# Patient Record
Sex: Male | Born: 1935 | Race: White | Hispanic: No | Marital: Married | Smoking: Former smoker
Health system: Southern US, Community
[De-identification: ages and names within clinical notes are randomized; demographics above are authoritative.]

## PROBLEM LIST (undated history)

## (undated) DIAGNOSIS — F0390 Unspecified dementia without behavioral disturbance: Secondary | ICD-10-CM

## (undated) DIAGNOSIS — I10 Essential (primary) hypertension: Secondary | ICD-10-CM

## (undated) DIAGNOSIS — M7550 Bursitis of unspecified shoulder: Secondary | ICD-10-CM

## (undated) DIAGNOSIS — N400 Enlarged prostate without lower urinary tract symptoms: Secondary | ICD-10-CM

## (undated) DIAGNOSIS — J449 Chronic obstructive pulmonary disease, unspecified: Secondary | ICD-10-CM

## (undated) DIAGNOSIS — M1990 Unspecified osteoarthritis, unspecified site: Secondary | ICD-10-CM

## (undated) DIAGNOSIS — R001 Bradycardia, unspecified: Secondary | ICD-10-CM

## (undated) HISTORY — DX: Benign prostatic hyperplasia without lower urinary tract symptoms: N40.0

## (undated) HISTORY — DX: Bradycardia, unspecified: R00.1

## (undated) HISTORY — PX: TOTAL KNEE ARTHROPLASTY: SHX125

## (undated) HISTORY — DX: Unspecified osteoarthritis, unspecified site: M19.90

## (undated) HISTORY — DX: Bursitis of unspecified shoulder: M75.50

## (undated) HISTORY — DX: Chronic obstructive pulmonary disease, unspecified: J44.9

## (undated) HISTORY — PX: LUMBAR LAMINECTOMY: SHX95

## (undated) HISTORY — DX: Essential (primary) hypertension: I10

---

## 1999-02-04 ENCOUNTER — Ambulatory Visit (HOSPITAL_BASED_OUTPATIENT_CLINIC_OR_DEPARTMENT_OTHER): Admission: RE | Admit: 1999-02-04 | Discharge: 1999-02-04 | Payer: Self-pay | Admitting: Orthopedic Surgery

## 1999-11-03 ENCOUNTER — Encounter: Payer: Self-pay | Admitting: Orthopedic Surgery

## 1999-11-05 ENCOUNTER — Inpatient Hospital Stay (HOSPITAL_COMMUNITY): Admission: RE | Admit: 1999-11-05 | Discharge: 1999-11-09 | Payer: Self-pay | Admitting: Orthopedic Surgery

## 1999-11-05 ENCOUNTER — Encounter: Payer: Self-pay | Admitting: Orthopedic Surgery

## 2004-08-06 ENCOUNTER — Ambulatory Visit: Payer: Self-pay | Admitting: Internal Medicine

## 2004-09-01 ENCOUNTER — Encounter: Admission: RE | Admit: 2004-09-01 | Discharge: 2004-09-01 | Payer: Self-pay | Admitting: Orthopedic Surgery

## 2004-09-04 ENCOUNTER — Ambulatory Visit (HOSPITAL_COMMUNITY): Admission: RE | Admit: 2004-09-04 | Discharge: 2004-09-04 | Payer: Self-pay | Admitting: Orthopedic Surgery

## 2004-09-04 ENCOUNTER — Ambulatory Visit (HOSPITAL_BASED_OUTPATIENT_CLINIC_OR_DEPARTMENT_OTHER): Admission: RE | Admit: 2004-09-04 | Discharge: 2004-09-04 | Payer: Self-pay | Admitting: Orthopedic Surgery

## 2005-02-04 ENCOUNTER — Ambulatory Visit: Payer: Self-pay | Admitting: Internal Medicine

## 2005-04-07 ENCOUNTER — Inpatient Hospital Stay (HOSPITAL_COMMUNITY): Admission: RE | Admit: 2005-04-07 | Discharge: 2005-04-11 | Payer: Self-pay | Admitting: Orthopedic Surgery

## 2006-04-07 ENCOUNTER — Ambulatory Visit: Payer: Self-pay | Admitting: Internal Medicine

## 2006-09-15 ENCOUNTER — Ambulatory Visit: Payer: Self-pay | Admitting: Internal Medicine

## 2006-09-20 ENCOUNTER — Ambulatory Visit (HOSPITAL_COMMUNITY): Admission: RE | Admit: 2006-09-20 | Discharge: 2006-09-20 | Payer: Self-pay | Admitting: Internal Medicine

## 2007-07-12 DIAGNOSIS — R351 Nocturia: Secondary | ICD-10-CM | POA: Insufficient documentation

## 2007-07-12 DIAGNOSIS — N400 Enlarged prostate without lower urinary tract symptoms: Secondary | ICD-10-CM | POA: Insufficient documentation

## 2007-07-12 DIAGNOSIS — J449 Chronic obstructive pulmonary disease, unspecified: Secondary | ICD-10-CM | POA: Insufficient documentation

## 2007-07-12 DIAGNOSIS — I10 Essential (primary) hypertension: Secondary | ICD-10-CM | POA: Insufficient documentation

## 2007-07-12 DIAGNOSIS — M1990 Unspecified osteoarthritis, unspecified site: Secondary | ICD-10-CM | POA: Insufficient documentation

## 2007-07-12 DIAGNOSIS — J4489 Other specified chronic obstructive pulmonary disease: Secondary | ICD-10-CM | POA: Insufficient documentation

## 2007-07-18 ENCOUNTER — Ambulatory Visit: Payer: Self-pay | Admitting: Internal Medicine

## 2007-11-24 ENCOUNTER — Ambulatory Visit: Payer: Self-pay

## 2007-12-19 ENCOUNTER — Encounter: Payer: Self-pay | Admitting: Internal Medicine

## 2007-12-22 ENCOUNTER — Encounter: Admission: RE | Admit: 2007-12-22 | Discharge: 2007-12-22 | Payer: Self-pay | Admitting: Orthopaedic Surgery

## 2008-01-10 ENCOUNTER — Encounter: Payer: Self-pay | Admitting: Internal Medicine

## 2008-01-10 ENCOUNTER — Encounter: Admission: RE | Admit: 2008-01-10 | Discharge: 2008-01-10 | Payer: Self-pay | Admitting: Orthopaedic Surgery

## 2008-01-18 ENCOUNTER — Ambulatory Visit: Payer: Self-pay | Admitting: Internal Medicine

## 2008-01-18 DIAGNOSIS — R413 Other amnesia: Secondary | ICD-10-CM | POA: Insufficient documentation

## 2008-02-22 ENCOUNTER — Encounter: Payer: Self-pay | Admitting: Internal Medicine

## 2008-04-11 ENCOUNTER — Ambulatory Visit: Payer: Self-pay | Admitting: Internal Medicine

## 2008-04-11 DIAGNOSIS — IMO0002 Reserved for concepts with insufficient information to code with codable children: Secondary | ICD-10-CM

## 2008-06-19 ENCOUNTER — Ambulatory Visit: Payer: Self-pay | Admitting: Internal Medicine

## 2008-08-09 ENCOUNTER — Telehealth: Payer: Self-pay | Admitting: *Deleted

## 2008-11-15 ENCOUNTER — Telehealth: Payer: Self-pay | Admitting: Internal Medicine

## 2008-11-20 ENCOUNTER — Encounter: Payer: Self-pay | Admitting: Internal Medicine

## 2008-12-27 ENCOUNTER — Ambulatory Visit: Payer: Self-pay | Admitting: Internal Medicine

## 2010-03-24 ENCOUNTER — Ambulatory Visit: Payer: Self-pay | Admitting: Internal Medicine

## 2010-09-30 ENCOUNTER — Ambulatory Visit
Admission: RE | Admit: 2010-09-30 | Discharge: 2010-09-30 | Payer: Self-pay | Source: Home / Self Care | Attending: Internal Medicine | Admitting: Internal Medicine

## 2010-10-06 NOTE — Assessment & Plan Note (Signed)
Summary: MED CK (REFILL) // RS   Vital Signs:  Patient profile:   77 year old male Height:      70 inches Temp:     98.2 degrees F oral Pulse rate:   80 / minute Resp:     16 per minute BP sitting:   144 / 80  (left arm)  Vitals Entered By: Willy Eddy, LPN (March 24, 2010 4:15 PM) CC: med review Is Patient Diabetic? No   CC:  med review.  History of Present Illness: progressive dementia the nameda and the aricept but has progressive back pain moved down right leg to the knee and has arthritis in the right hand HTN stable  Preventive Screening-Counseling & Management  Alcohol-Tobacco     Smoking Status: quit  Current Problems (verified): 1)  Wrist Pain, Right  (ICD-719.43) 2)  Back Pain With Radiculopathy  (ICD-729.2) 3)  Memory Loss  (ICD-780.93) 4)  COPD  (ICD-496) 5)  Osteoarthritis  (ICD-715.90) 6)  Nocturia  (ICD-788.43) 7)  Benign Prostatic Hypertrophy  (ICD-600.00) 8)  Hypertension  (ICD-401.9)  Current Medications (verified): 1)  Terazosin Hcl 5 Mg Caps (Terazosin Hcl) .... Once Daily 2)  Furosemide 20 Mg Tabs (Furosemide) .... Once Daily 3)  Tarka 2-240 Mg Tbcr (Trandolapril-Verapamil Hcl) .... Two Times A Day 4)  Multivitamins   Caps (Multiple Vitamin) .... Once Daily 5)  Aricept 10 Mg Tabs (Donepezil Hcl) .... One By Mouth Daily 6)  Ultram 50 Mg  Tabs (Tramadol Hcl) .... One By Mouth Q 6 Hour For Pain 7)  Namenda 10 Mg Tabs (Memantine Hcl) .... One By Mouth Two Times A Day  in Addition To The Aricept  Allergies (verified): No Known Drug Allergies   Impression & Recommendations:  Problem # 1:  BACK PAIN WITH RADICULOPATHY (ICD-729.2) needs pain control at night tylenol works in the day urged the use of the tramadol  Problem # 2:  MEMORY LOSS (ICD-780.93) on aricept with the namenda stalbe  Problem # 3:  NOCTURIA (ICD-788.43) stable meds terazosin three times a day  Problem # 4:  HYPERTENSION (ICD-401.9)  His updated medication list  for this problem includes:    Terazosin Hcl 5 Mg Caps (Terazosin hcl) ..... Once daily    Furosemide 20 Mg Tabs (Furosemide) ..... Once daily    Tarka 2-240 Mg Tbcr (Trandolapril-verapamil hcl) .Marland Kitchen..Marland Kitchen Two times a day  BP today: 144/80 Prior BP: 146/80 (12/27/2008)  Prior 10 Yr Risk Heart Disease: Not enough information (07/18/2007)  Complete Medication List: 1)  Terazosin Hcl 5 Mg Caps (Terazosin hcl) .... Once daily 2)  Furosemide 20 Mg Tabs (Furosemide) .... Once daily 3)  Tarka 2-240 Mg Tbcr (Trandolapril-verapamil hcl) .... Two times a day 4)  Multivitamins Caps (Multiple vitamin) .... Once daily 5)  Aricept 10 Mg Tabs (Donepezil hcl) .... One by mouth daily 6)  Tramadol Hcl 50 Mg Tabs (Tramadol hcl) .... One by mouth three times a day as needed 7)  Namenda 10 Mg Tabs (Memantine hcl) .... One by mouth two times a day  in addition to the aricept  Patient Instructions: 1)  Please schedule a follow-up appointment in 4 months. Prescriptions: TRAMADOL HCL 50 MG TABS (TRAMADOL HCL) one by mouth three times a day as needed  #270 x 2   Entered and Authorized by:   Stacie Glaze MD   Signed by:   Stacie Glaze MD on 03/24/2010   Method used:   Electronically to  [facility]* (mail-order)       [address]       [place], [place]  [address]       Ph: [phone]       Fax: [phone]   RxID:   [id number] NAMENDA 10 MG TABS (MEMANTINE HCL) one by mouth two times a day  in addition to the aricept  #180 Each x 3   Entered by:   Willy Eddy, LPN   Authorized by:   Stacie Glaze MD   Signed by:   Willy Eddy, LPN on 84/13/2440   Method used:   Electronically to        [facility]* (mail-order)       [address]       [place], [place]  [address]       Ph: [phone]       Fax: [phone]   RxID:   [id number] ARICEPT 10 MG TABS (DONEPEZIL HCL) one by mouth daily  #90 x 3   Entered by:   Willy Eddy, LPN    Authorized by:   Stacie Glaze MD   Signed by:   Willy Eddy, LPN on 30/16/0109   Method used:   Electronically to        [facility]* (mail-order)       [address]       [place], [place]  [address]       Ph: [phone]       Fax: [phone]   RxID:   [id number] TARKA 2-240 MG TBCR (TRANDOLAPRIL-VERAPAMIL HCL) two times a day  #90 x 3   Entered by:   Willy Eddy, LPN   Authorized by:   Stacie Glaze MD   Signed by:   Willy Eddy, LPN on 48/54/6270   Method used:   Electronically to        [facility]* (mail-order)       [address]       [place], [place]  [address]       Ph: [phone]       Fax: [phone]   RxID:   [id number] FUROSEMIDE 20 MG TABS (FUROSEMIDE) once daily  #90 x 3   Entered by:   Willy Eddy, LPN   Authorized by:   Stacie Glaze MD   Signed by:   Willy Eddy, LPN on 35/36/1443   Method used:   Electronically to        [facility]* (mail-order)       [address]       [place], [place]  [address]       Ph: [phone]       Fax: [phone]   RxID:   [id number] TERAZOSIN HCL 5 MG CAPS (TERAZOSIN HCL) once daily  #90 x 3   Entered by:   Willy Eddy, LPN   Authorized by:   Stacie Glaze MD   Signed by:   Willy Eddy, LPN on 19/37/9024   Method used:   Electronically to        [facility]* (mail-order)       [address]   [address]       Ph: [phone]       Fax: [phone]   RxID:   [id number]

## 2010-10-08 NOTE — Assessment & Plan Note (Signed)
Summary: 6 month fup//ccm/pt rescd from bump//ccm   Vital Signs:  Patient profile:   77 year old male Height:      70 inches Weight:      234 pounds BMI:     33.70 Temp:     98.2 degrees F oral Pulse rate:   84 / minute Resp:     20 per minute BP sitting:   134 / 80  (left arm)  Vitals Entered By: Willy Eddy, LPN (September 30, 2010 1:41 PM) CC: roa- daughter concerned about loss os appetite, Hypertension Management Is Patient Diabetic? No   CC:  roa- daughter concerned about loss os appetite and Hypertension Management.  History of Present Illness: feels good has some "numbness in the right hand" Has good strength.  No falls but walking on two canes due to severe DJD in knees Has lost appetitie and has ;lost 16 pound  Hypertension History:      He denies headache, chest pain, palpitations, dyspnea with exertion, orthopnea, PND, peripheral edema, visual symptoms, neurologic problems, syncope, and side effects from treatment.        Positive major cardiovascular risk factors include male age 50 years old or older and hypertension.  Negative major cardiovascular risk factors include non-tobacco-user status.     Preventive Screening-Counseling & Management  Alcohol-Tobacco     Smoking Status: never     Tobacco Counseling: not indicated; no tobacco use  Problems Prior to Update: 1)  Wrist Pain, Right  (ICD-719.43) 2)  Back Pain With Radiculopathy  (ICD-729.2) 3)  Memory Loss  (ICD-780.93) 4)  COPD  (ICD-496) 5)  Osteoarthritis  (ICD-715.90) 6)  Nocturia  (ICD-788.43) 7)  Benign Prostatic Hypertrophy  (ICD-600.00) 8)  Hypertension  (ICD-401.9)  Current Problems (verified): 1)  Wrist Pain, Right  (ICD-719.43) 2)  Back Pain With Radiculopathy  (ICD-729.2) 3)  Memory Loss  (ICD-780.93) 4)  COPD  (ICD-496) 5)  Osteoarthritis  (ICD-715.90) 6)  Nocturia  (ICD-788.43) 7)  Benign Prostatic Hypertrophy  (ICD-600.00) 8)  Hypertension  (ICD-401.9)  Medications Prior to  Update: 1)  Terazosin Hcl 5 Mg Caps (Terazosin Hcl) .... Once Daily 2)  Furosemide 20 Mg Tabs (Furosemide) .... Once Daily 3)  Tarka 2-240 Mg Tbcr (Trandolapril-Verapamil Hcl) .... Two Times A Day 4)  Multivitamins   Caps (Multiple Vitamin) .... Once Daily 5)  Aricept 10 Mg Tabs (Donepezil Hcl) .... One By Mouth Daily 6)  Tramadol Hcl 50 Mg Tabs (Tramadol Hcl) .... One By Mouth Three Times A Day As Needed 7)  Namenda 10 Mg Tabs (Memantine Hcl) .... One By Mouth Two Times A Day  in Addition To The Aricept  Current Medications (verified): 1)  Terazosin Hcl 5 Mg Caps (Terazosin Hcl) .... Once Daily 2)  Furosemide 20 Mg Tabs (Furosemide) .... Once Daily 3)  Trandolapril 2 Mg Tabs (Trandolapril) .Marland Kitchen.. 1 Two Times A Day 4)  Multivitamins   Caps (Multiple Vitamin) .... Once Daily 5)  Aricept 10 Mg Tabs (Donepezil Hcl) .... One By Mouth Daily 6)  Tramadol Hcl 50 Mg Tabs (Tramadol Hcl) .... One By Mouth Three Times A Day As Needed 7)  Namenda 10 Mg Tabs (Memantine Hcl) .... One By Mouth Two Times A Day  in Addition To The Aricept 8)  Verapamil Hcl Cr 240 Mg Cr-Tabs (Verapamil Hcl) .Marland Kitchen.. 1 Two Times A Day  Allergies (verified): No Known Drug Allergies  Past History:  Social History: Last updated: 07/18/2007 Retired Married Former Smoker  Risk Factors:  Smoking Status: never (09/30/2010)  Past medical, surgical, family and social histories (including risk factors) reviewed, and no changes noted (except as noted below).  Past Medical History: Reviewed history from 04/11/2008 and no changes required. Hypertension Benign prostatic hypertrophy Osteoarthritis COPD bursitis in shoulder bradycardia  Past Surgical History: Reviewed history from 01/18/2008 and no changes required. Total knee replacement 2006 Lumbar laminectomy Lumbar fusion lumbar injections PMH-FH-SH reviewed-no changes except otherwise noted  Family History: Reviewed history and no changes required.  Social  History: Reviewed history from 07/18/2007 and no changes required. Retired Married Former Smoker Smoking Status:  never  Review of Systems  The patient denies anorexia, fever, weight loss, weight gain, vision loss, decreased hearing, hoarseness, chest pain, syncope, dyspnea on exertion, peripheral edema, prolonged cough, headaches, hemoptysis, abdominal pain, melena, hematochezia, severe indigestion/heartburn, hematuria, incontinence, genital sores, muscle weakness, suspicious skin lesions, transient blindness, difficulty walking, depression, unusual weight change, abnormal bleeding, enlarged lymph nodes, angioedema, and breast masses.    Physical Exam  General:  Well-developed,well-nourished,in no acute distress; alert,appropriate and cooperative throughout examination Head:  male-pattern balding.  atraumatic.   Eyes:  pupils equal and pupils round.   Ears:  R ear normal and L ear normal.   Nose:  External nasal examination shows no deformity or inflammation. Nasal mucosa are pink and moist without lesions or exudates. Mouth:  Oral mucosa and oropharynx without lesions or exudates.  Teeth in good repair. Neck:  No deformities, masses, or tenderness noted. Lungs:  Normal respiratory effort, chest expands symmetrically. Lungs are clear to auscultation, no crackles or wheezes. Heart:  regular rhythm, no gallop, bradycardia, and Grade   1/6 systolic ejection murmur.   Abdomen:  Bowel sounds positive,abdomen soft and non-tender without masses, organomegaly or hernias noted. Msk:  decreased ROM and joint tenderness.   Pulses:  R and L carotid,radial,femoral,dorsalis pedis and posterior tibial pulses are full and equal bilaterally Extremities:  1+ left pedal edema and 1+ right pedal edema.   Neurologic:  cranial nerves II-XII intact and finger-to-nose normal.   Skin:  Intact without suspicious lesions or rashes Psych:  subdued, poor concentration, and memory impairment.     Impression &  Recommendations:  Problem # 1:  MEMORY LOSS (ICD-780.93) cannot recall any objects but reads clock... cannot draw clock seems to be stable  Problem # 2:  COPD (ICD-496) Assessment: Unchanged  Vaccines Reviewed: Flu Vax: Fluvax 3+ (06/19/2008)  Problem # 3:  OSTEOARTHRITIS (ICD-715.90)  His updated medication list for this problem includes:    Tramadol Hcl 50 Mg Tabs (Tramadol hcl) ..... One by mouth three times a day as needed  Discussed use of medications, application of heat or cold, and exercises.   Problem # 4:  HYPERTENSION (ICD-401.9)  His updated medication list for this problem includes:    Terazosin Hcl 5 Mg Caps (Terazosin hcl) ..... Once daily    Furosemide 20 Mg Tabs (Furosemide) ..... Once daily    Trandolapril 2 Mg Tabs (Trandolapril) .Marland Kitchen... 1 two times a day    Verapamil Hcl Cr 240 Mg Cr-tabs (Verapamil hcl) .Marland Kitchen... 1 two times a day  BP today: 134/80 Prior BP: 144/80 (03/24/2010)  Prior 10 Yr Risk Heart Disease: Not enough information (07/18/2007)  Complete Medication List: 1)  Terazosin Hcl 5 Mg Caps (Terazosin hcl) .... Once daily 2)  Furosemide 20 Mg Tabs (Furosemide) .... Once daily 3)  Trandolapril 2 Mg Tabs (Trandolapril) .Marland Kitchen.. 1 two times a day 4)  Multivitamins Caps (Multiple vitamin) .... Once  daily 5)  Aricept 10 Mg Tabs (Donepezil hcl) .... One by mouth daily 6)  Tramadol Hcl 50 Mg Tabs (Tramadol hcl) .... One by mouth three times a day as needed 7)  Namenda 10 Mg Tabs (Memantine hcl) .... One by mouth two times a day  in addition to the aricept 8)  Verapamil Hcl Cr 240 Mg Cr-tabs (Verapamil hcl) .Marland Kitchen.. 1 two times a day  Hypertension Assessment/Plan:      The patient's hypertensive risk group is category B: At least one risk factor (excluding diabetes) with no target organ damage.  Today's blood pressure is 134/80.  His blood pressure goal is < 140/90.  Patient Instructions: 1)  Please schedule a follow-up appointment in 4  months. Prescriptions: NAMENDA 10 MG TABS (MEMANTINE HCL) one by mouth two times a day  in addition to the aricept  #180 Each x 3   Entered by:   Willy Eddy, LPN   Authorized by:   Stacie Glaze MD   Signed by:   Willy Eddy, LPN on 16/06/9603   Method used:   Electronically to        [facility]* (retail)       [address] [place], [place]  [address]       Ph: [phone] or [phone]       Fax: [phone]   RxID:   [id number] ARICEPT 10 MG TABS (DONEPEZIL HCL) one by mouth daily  #90 x 3   Entered by:   Willy Eddy, LPN   Authorized by:   Stacie Glaze MD   Signed by:   Willy Eddy, LPN on 40/34/7425   Method used:   Electronically to        [facility]* (retail)       [address] [place], [place]  [address]       Ph: [phone] or [phone]       Fax: [phone]   RxID:   [id number] FUROSEMIDE 20 MG TABS (FUROSEMIDE) once daily  #90 x 3   Entered by:   Willy Eddy, LPN   Authorized by:   Stacie Glaze MD   Signed by:   Willy Eddy, LPN on 62/83/1517   Method used:   Electronically to        [facility]* (retail)       [address] [place], [place]  [address]       Ph: [phone] or [phone]       Fax: [phone]   RxID:   [id number] TERAZOSIN HCL 5 MG CAPS (TERAZOSIN HCL) once daily  #90 x 3   Entered by:   Willy Eddy, LPN   Authorized by:   Stacie Glaze MD   Signed by:   Willy Eddy, LPN on 25/85/2778   Method used:   Electronically to        [facility]* (retail)       [address] [place], [place]  [address]  Ph: [phone] or [phone]       Fax: [phone]   RxID:   [id number] VERAPAMIL HCL CR 240 MG CR-TABS  (VERAPAMIL HCL) 1 two times a day  #60 x 6   Entered by:   Willy Eddy, LPN   Authorized by:   Stacie Glaze MD   Signed by:   Willy Eddy, LPN on 72/53/6644   Method used:   Electronically to        [facility]* (retail)       [address] [place], [place]  [address]       Ph: [phone] or [phone]       Fax: [phone]   RxID:   [id number] TRANDOLAPRIL 2 MG TABS (TRANDOLAPRIL) 1 two times a day  #60 x 6   Entered by:   Willy Eddy, LPN   Authorized by:   Stacie Glaze MD   Signed by:   Willy Eddy, LPN on 27/02/2375   Method used:   Electronically to        [facility]* (retail)       [address] [place], [place]  [address]       Ph: [phone] or [phone]       Fax: [phone]   RxID:   [id number]    Orders Added: 1)  Est. Patient Level IV [10175]

## 2011-01-22 NOTE — Discharge Summary (Signed)
NAME:  Glen Baldwin, Glen Baldwin                 ACCOUNT NO.:  [id number]   MEDICAL RECORD NO.:  [id number]          PATIENT TYPE:  INP   LOCATION:  1521                         FACILITY:  [facility]   PHYSICIAN:  Georges Lynch. Gioffre, M.D.DATE OF BIRTH:  11-01-1924   DATE OF ADMISSION:  04/07/2005  DATE OF DISCHARGE:  04/11/2005                                 DISCHARGE SUMMARY   ADMISSION DIAGNOSES:  1.  Degenerative arthritis, right knee.  2.  Hypertension.  3.  Benign prostatic hypertrophy.   DISCHARGE DIAGNOSES:  1.  Degenerative arthritis, right knee, status post right total knee      arthroplasty.  2.  Hypertension.  3.  Benign prostatic hypertrophy.   PROCEDURE:  Patient was admitted to [facility] and taken to the  operating room on April 07, 2005 to undergo a right total knee arthroplasty.  Surgeon Ranee Gosselin, M.D.  Assistant [employer], PA-C.  Surgery was  performed under general anesthesia.  Hemovac drain was placed at the time of  surgery.   BRIEF HISTORY:  This is a 76 year old male who has been having right knee  pain for more than a year.  His pain has been increasing.  It was difficult  for him to ambulate.  Patient underwent a right knee arthroscopy in February  2005, and his symptoms temporarily improved, but now he is having increased  knee pain and feels that his knee is unstable.  He has very limited range of  motion and is ready to proceed with a right total knee arthroplasty.  He was  admitted to the hospital for same.   LABORATORY DATA:  Admission CBC WBC 7.4, RBC 4.40, hemoglobin 14.4,  hematocrit 41.6, platelet count 193.  Admission PT 13.7, INR 1.0, PTT 33.  Admission chemistry unremarkable except for a slightly elevated glucose of  101.  Patient's admission urinalysis was normal.  Patient's blood type O+,  negative antibody screen.  Preoperative x-ray of right knee revealed  prominent right medial tibial/femoral joint space degenerative changes with  joint space narrowing and mild osteophyte formation.  Postoperative x-ray of  the right knee revealed right total knee prosthesis in good alignment.  I do  not see the patient's preoperative chest x-ray or EKG on the medical record.   HOSPITAL COURSE:  Patient was admitted to [facility] and taken to  the operating room.  He underwent the above-stated procedure without  complications.  He tolerated the procedure well and was allowed to return to  the recovery room and then orthopedic floor to continue his postoperative  care.  Hemovac drain was placed at the time of surgery and was discontinued  on postoperative day #1.  Patient was placed on PC analgesia for pain  control.  His pain was well controlled.  Patient was weaned over to oral  analgesics, and his PCA was discontinued on postoperative day #3.  Patient's  hemoglobin and hematocrit were followed throughout his hospitalization.  He  had a postoperative drop in his hemoglobin to 9.0, but that stabilized prior  to discharge, and he did not  require a blood transfusion.  PT was consulted  for gait training ambulation.  Patient made good progress and was able to  ambulate with aid of a walker.  It was felt that he could be discharged home  on April 11, 2005.   DISCHARGE MEDICATIONS:  1.  Coumadin 7.5 mg daily.  2.  Robaxin 500 mg daily for spasm  3.  Percocet 10/650 1-2 every 6 hours as needed for pain.  4.  Trinsicon 1 tablet three times daily for anemia.   WOUND CARE:  Daily dressing changes.  Keep wound dry and clean.  Ambulate  full weightbearing with walker.   FOLLOW UP:  Patient will follow up with Dr. Darrelyn Hillock 2 weeks from the date of  surgery.  [facility] will monitor his PT/INR at home.   CONDITION ON DISCHARGE:  Stable.      Ebbie Ridge. Paitsel, P.A.    ______________________________  Georges Lynch Darrelyn Hillock, M.D.    Tilden Dome  D:  05/05/2005  T:  05/05/2005  Job:  [id number]

## 2011-01-22 NOTE — Op Note (Signed)
NAME:  Glen Baldwin, Glen Baldwin                 ACCOUNT NO.:  [id number]   MEDICAL RECORD NO.:  [id number]          PATIENT TYPE:  AMB   LOCATION:  [facility]                         FACILITY:  [facility]   PHYSICIAN:  Georges Lynch. Gioffre, M.D.DATE OF BIRTH:  06/10/1925   DATE OF PROCEDURE:  09/04/2004  DATE OF DISCHARGE:                                 OPERATIVE REPORT   SURGEON:  Windy Fast A. Darrelyn Hillock, M.D.   ASSISTANT:  Nurse.   PREOPERATIVE DIAGNOSES:  1.  Degenerative arthritis of the right knee.  2.  Severe degenerative complex tear of the medial meniscus, right knee.   POSTOPERATIVE DIAGNOSIS:  1.  Degenerative arthritis of the right knee.  2.  Severe degenerative complex tear of the medial meniscus, right knee.   OPERATION:  1.  Diagnostic arthroscopy, right knee.  2.  Abrasion chondroplasty, medial femoral condyle, right knee.  3.  Medial meniscectomy, right knee.   PROCEDURE:  Under general anesthesia, routine orthopedic prepping and  draping of the right lower extremity was carried out.  He had 1 gm of IV  Ancef.  At this time, a small punctate incision was made in the  suprapatellar pouch.  The inflow cannula was inserted, and the knee was  distended with saline.  At this time, another small punctate incision was  made in the anterolateral joint.  The arthroscope was entered, and a  complete diagnostic arthroscopy was carried out.  I noted that the  suprapatellar pouch shows some minimal arthritic change in that area,  synovitis.  The lateral joint was relatively clean.  There was some early  arthritis.  The cruciates were intact.  The medial joint was the main  problem.  It was literally severely arthritic.  There was no articular  cartilage present.  The medial meniscus was severely torn.  There was a  severe complex tear.  I did a partial medial meniscectomy and abrasion  chondroplasty over the medial femoral condyle.  There was some very loose  hanging type of cartilage in this fragment  present that we shaved off.  Thoroughly irrigated out the knee and removed all of the loose fragments.  Close the three punctate incisions with 3-0 nylon sutures.  Sterile  Neosporin dressing was applied after I injected 20 cc of 0.5% Marcaine with  epinephrine into the knee joint.  The patient left the operating room table  in satisfactory condition.   Follow-up care will be on aspirin 325 mg twice daily for two weeks as an  anticoagulant, and he will be on Percocet 10/650 for pain.  He will be on  crutches.  Full weightbearing as tolerated.  See me in the office in 12-14  days or prior to that if there is a problem.      RAG/MEDQ  D:  09/04/2004  T:  09/04/2004  Job:  [id number]

## 2011-01-22 NOTE — Op Note (Signed)
NAME:  Glen Baldwin, Glen Baldwin                 ACCOUNT NO.:  [id number]   MEDICAL RECORD NO.:  [id number]          PATIENT TYPE:  INP   LOCATION:  X001                         FACILITY:  [facility]   PHYSICIAN:  Georges Lynch. Gioffre, M.D.DATE OF BIRTH:  1925/02/15   DATE OF PROCEDURE:  04/07/2005  DATE OF DISCHARGE:                                 OPERATIVE REPORT   PREOPERATIVE DIAGNOSIS:  Degenerative arthritis of the right knee.   POSTOPERATIVE DIAGNOSIS:  Degenerative arthritis of the right knee.   SURGEON:  Georges Lynch. Darrelyn Hillock, M.D.   ASSISTANT:  Ebbie Ridge. Paitsel, P.A.   OPERATION:  Right total knee arthroplasty. The DePuy system was used  rotating type platform insert was used as well for the tibia. The sizes used  were a size four tibial tray, a size four 12.5 mm thickness tibial insert, a  size four right cruciate substituting femoral component, a 38 mm patella  with three pegs and all three components were cemented and vancomycin was  used in the cement.   DESCRIPTION OF PROCEDURE:  Under spinal anesthesia, routine orthopedic prep  and draping of the right lower extremity was carried out. The patient had 1  gram of IV Ancef preop. At this time, the leg was exsanguinated with an  Esmarch tourniquet and was elevated 400 mmHg. At this time, a midline  incision was made over the anterior aspect of right knee, bleeders  identified and cauterized. Two flaps were created. I then carried out a  median parapatellar incision. I reflected the patella laterally and flexed  the knee and did lateral medial meniscectomies and excised the anterior and  posterior cruciate ligaments. The spurs then were removed from the tibia,  patella and femur. I then went on and made my initial drill hole in the  intercondylar notch and took 12 mm thickness off the distal femur utilizing  the intramedullary guide. Following this, I then went on and utilized a #2  jig to carry out my anterior, posterior and chamfering cuts  for a size four  right femur. Following this, we then went on and cut our  tibia in the usual  fashion. I removed 6 mm thickness utilizing our measurements from the  affected medial side. Following that, I then cut my keel cut in the tibial  plateau in the usual fashion. After the tibia was prepared for a size four  tray, I then cut my knife cut out of the intercondylar notch of the femur. I  then went through the trials, utilized a size four right femoral component,  a size four tibial tray with a 12 mm thickness insert and we had excellent  stability. We first tried a 10 mm thickness insert and it was rather loose  so we then went on to a 12.5 mm thickness tibial insert and had perfect  alignment. Before we did the trials, we first utilized our spacer blocks in  flexion and extension. Following this, I then prepared the patella. I  removed the appropriate amount of articular surface of the patella. Three  drill holes were drilled in  the patella for a size 38 mm patella. We then  removed the trial components,  thoroughly water picked out the knee and then  cemented all three components in simultaneously. Vancomycin was used in the  cement. We then went through trials again and we finally selected a 12 mm  thickness tibial insert. We removed all loose pieces of cement, dried the  knee out and then inserted our permanent rotating platform tibial insert.  The knee then was taken through motion, we had excellent stability. We then  inserted our Hemovac drain, closed the knee in  layers in the usual fashion after we water picked the knee again. We then  applied sterile dressing and the patient left the operative room in  satisfactory condition. The assistant was Ebbie Ridge. Paitsel, P.A., surgeon  Georges Lynch Darrelyn Hillock, M.D.       RAG/MEDQ  D:  04/07/2005  T:  04/07/2005  Job:  [id number]   cc:   Stacie Glaze, M.D. [facility], [place]

## 2011-01-22 NOTE — H&P (Signed)
NAME:  Glen Baldwin, Glen Baldwin                 ACCOUNT NO.:  [id number]   MEDICAL RECORD NO.:  [id number]          PATIENT TYPE:  INP   LOCATION:  NA                           FACILITY:  [facility]   PHYSICIAN:  Georges Lynch. Gioffre, M.D.DATE OF BIRTH:  [birth year]   DATE OF ADMISSION:  DATE OF DISCHARGE:                                HISTORY & PHYSICAL   HISTORY:  The patient has been having right knee pain for more than a year.  He is having increasing pain with ambulation. He had a right knee  arthroscopy in February 2005 and his symptoms improved temporarily. Now he  is having more knee pain and his knee feels unstable. He has very limited  range of motion and he is ready to proceed with a right total knee  arthroplasty.   ALLERGIES:  None known.   PRIMARY CARE PHYSICIAN:  Dr. Lovell Sheehan.   PAST MEDICAL HISTORY:  Significant for hypertension and BPH.   CURRENT MEDICATIONS:  1.  Tarka 2/240 mg twice daily.  2.  Lasix 20 mg daily.  3.  Terazosin 5 mg daily.   PAST SURGICAL HISTORY:  The patient has had a left total knee arthroplasty  in 2001, right knee arthroscopy in February 2005. He had back surgery 12  years ago for spinal stenosis by Dr. Berneice Gandy in [place].   FAMILY HISTORY:  Not available.   REVIEW OF SYMPTOMS:  GENERAL:  Denies weight change, fever, chills, fatigue.  HEENT:  Denies headache, visual changes, tinnitus, hearing loss or sore  throat. CARDIOVASCULAR:  Denies chest pain, palpitations, shortness of  breath, orthopnea. PULMONARY:  Denies dyspnea, wheezing, cough, sputum  production, hemoptysis.  GI:  Denies nausea, vomiting, hematemesis, or  abdominal pain. GU:  Denies dysuria, frequency, urgency, hematuria.  ENDOCRINE:  Denies polyuria, polydipsia, appetite change, heat or cold  intolerance. MUSCULOSKELETAL:  The patient has right knee pain. NEUROLOGIC:  Denies dizziness, vertigo, syncope, seizure. SKIN:  Denies itching, rashes,  masses or moles.   PHYSICAL EXAMINATION:   VITAL SIGNS:  Temperature is 98.1, pulse is 62,  respirations 18, blood pressure 120/80.  GENERAL:  Well-developed, well-nourished, white male in no acute distress.  HEENT:  PERRL.  EOM's intact. Pharynx clear.  NECK:  Supple.  CHEST:  Clear to auscultation bilaterally. No wheezing, rales or rhonchi  noted.  HEART:  Regular rate and rhythm without murmur.  ABDOMEN:  Positive bowel sounds, soft, nontender.  EXTREMITIES:  Examination of the right knee reveals flexion to about 115 to  120 degrees. He does have some varicosities.  SKIN:  Warm and dry. X-ray of his right knee reveals complete collapse of  the medial joint.   IMPRESSION:  Degenerative arthritis right knee.   PLAN:  The patient is to be admitted to [facility] April 07, 2005  to undergo a right total knee arthroplasty.      Haynes Hoehn  D:  04/06/2005  T:  04/06/2005  Job:  [id number]

## 2011-01-22 NOTE — Discharge Summary (Signed)
NAME:  Glen Baldwin, Glen Baldwin                 ACCOUNT NO.:  [id number]   MEDICAL RECORD NO.:  [id number]          PATIENT TYPE:  INP   LOCATION:  1521                         FACILITY:  [facility]   PHYSICIAN:  Ebbie Ridge. Paitsel, P.A.  DATE OF BIRTH:  10-15-1924   DATE OF ADMISSION:  04/07/2005  DATE OF DISCHARGE:  04/11/2005                                 DISCHARGE SUMMARY   Audio too short to transcribe (less than 5 seconds)      Ebbie Ridge. Paitsel, P.A.     LKP/MEDQ  D:  05/05/2005  T:  05/05/2005  Job:  [id number]

## 2011-01-26 ENCOUNTER — Ambulatory Visit: Payer: Self-pay | Admitting: Internal Medicine

## 2011-01-29 ENCOUNTER — Ambulatory Visit: Payer: Self-pay | Admitting: Internal Medicine

## 2011-04-08 ENCOUNTER — Encounter: Payer: Self-pay | Admitting: Internal Medicine

## 2011-04-14 ENCOUNTER — Ambulatory Visit (INDEPENDENT_AMBULATORY_CARE_PROVIDER_SITE_OTHER): Payer: Medicare Other | Admitting: Internal Medicine

## 2011-04-14 ENCOUNTER — Encounter: Payer: Self-pay | Admitting: Internal Medicine

## 2011-04-14 DIAGNOSIS — I10 Essential (primary) hypertension: Secondary | ICD-10-CM

## 2011-04-14 DIAGNOSIS — J449 Chronic obstructive pulmonary disease, unspecified: Secondary | ICD-10-CM

## 2011-04-14 DIAGNOSIS — G309 Alzheimer's disease, unspecified: Secondary | ICD-10-CM

## 2011-04-14 DIAGNOSIS — J4489 Other specified chronic obstructive pulmonary disease: Secondary | ICD-10-CM

## 2011-04-14 DIAGNOSIS — R413 Other amnesia: Secondary | ICD-10-CM

## 2011-04-14 DIAGNOSIS — F0280 Dementia in other diseases classified elsewhere without behavioral disturbance: Secondary | ICD-10-CM

## 2011-04-14 NOTE — Progress Notes (Signed)
  Subjective:    Patient ID: Glen Baldwin, male    DOB: 1925-04-28, 77 y.o.   MRN: [id number]  HPI  Patient has a history of Alzheimer's type dementia osteoarthritis in knees bilaterally mild gait disorder COPD and hypertension.  He has been on dual therapy for his Alzheimer's disease with Namenda and Aricept and has had a slowing of his progression of his disease.  He is still pleasant alert oriented to person but not place and situation has a smile on his face and continues to try to ambulate as much as possible.  His breathing has been stable his blood pressure is stable on his current medications his wife make sure that he eats appropriately and takes his medications on schedule  Review of Systems  Constitutional: Negative for fever and fatigue.  HENT: Negative for hearing loss, congestion, neck pain and postnasal drip.   Eyes: Negative for discharge, redness and visual disturbance.  Respiratory: Negative for cough, shortness of breath and wheezing.   Cardiovascular: Negative for leg swelling.  Gastrointestinal: Negative for abdominal pain, constipation and abdominal distention.  Genitourinary: Negative for urgency and frequency.  Musculoskeletal: Positive for myalgias, joint swelling and gait problem. Negative for arthralgias.  Skin: Negative for color change and rash.  Neurological: Negative for weakness and light-headedness.  Hematological: Negative for adenopathy.  Psychiatric/Behavioral: Positive for confusion and decreased concentration. Negative for behavioral problems.   Past Medical History  Diagnosis Date  . Hypertension   . COPD (chronic obstructive pulmonary disease)   . Arthritis   . BPH (benign prostatic hyperplasia)   . Bursitis of shoulder   . Bradycardia    Past Surgical History  Procedure Date  . Total knee arthroplasty   . Lumbar laminectomy     reports that he has quit smoking. He does not have any smokeless tobacco history on file. He reports that he  does not drink alcohol. His drug history not on file. family history is not on file. No Known Allergies     Objective:   Physical Exam  Vitals reviewed. Constitutional: He appears well-developed and well-nourished.  HENT:  Head: Normocephalic and atraumatic.  Eyes: Conjunctivae are normal. Pupils are equal, round, and reactive to light.  Neck: Normal range of motion. Neck supple.  Cardiovascular: Normal rate and regular rhythm.   Pulmonary/Chest: Effort normal and breath sounds normal.  Abdominal: Soft. Bowel sounds are normal.          Assessment & Plan:  Advanced stage dementia stable on current medication. Discussion of diet at his age of 55 with advanced age dementia we have liberalized his diet to include whatever pleases him. His wife is dedicated towards keeping him healthy and has no bedsores no signs of neglect.  Discussed with the son and wife the fact that he should not drive a difficult course at this point because of the danger of his memory disorder we reviewed his medications and liberalized his Ultram as needed for arthritic pain we discussed keeping him active with taking short walks every day.  He'll follow up in 6 months

## 2011-04-14 NOTE — Patient Instructions (Signed)
No driving °

## 2011-06-01 ENCOUNTER — Other Ambulatory Visit: Payer: Self-pay | Admitting: *Deleted

## 2011-06-01 MED ORDER — TRAMADOL HCL 50 MG PO TABS: 50.0000 mg | ORAL_TABLET | Freq: Four times a day (QID) | ORAL | Status: DC | PRN

## 2011-09-13 ENCOUNTER — Other Ambulatory Visit: Payer: Self-pay | Admitting: *Deleted

## 2011-09-13 MED ORDER — VERAPAMIL HCL ER 240 MG PO TBCR: 240.0000 mg | EXTENDED_RELEASE_TABLET | Freq: Two times a day (BID) | ORAL | Status: DC

## 2011-09-13 MED ORDER — TRAMADOL HCL 50 MG PO TABS: 25.0000 mg | ORAL_TABLET | Freq: Two times a day (BID) | ORAL | Status: DC

## 2011-09-15 ENCOUNTER — Other Ambulatory Visit: Payer: Self-pay | Admitting: *Deleted

## 2011-09-15 MED ORDER — TRANDOLAPRIL 2 MG PO TABS: 2.0000 mg | ORAL_TABLET | Freq: Every day | ORAL | Status: DC

## 2011-09-28 ENCOUNTER — Other Ambulatory Visit: Payer: Self-pay | Admitting: Internal Medicine

## 2011-09-30 ENCOUNTER — Telehealth: Payer: Self-pay | Admitting: *Deleted

## 2011-09-30 ENCOUNTER — Telehealth: Payer: Self-pay | Admitting: Family Medicine

## 2011-09-30 ENCOUNTER — Other Ambulatory Visit: Payer: Self-pay | Admitting: *Deleted

## 2011-09-30 MED ORDER — TRANDOLAPRIL 2 MG PO TABS: 2.0000 mg | ORAL_TABLET | Freq: Every day | ORAL | Status: DC

## 2011-09-30 MED ORDER — TRANDOLAPRIL 2 MG PO TABS: ORAL_TABLET | ORAL | Status: DC

## 2011-09-30 NOTE — Telephone Encounter (Signed)
Sent in new script and called [facility]

## 2011-09-30 NOTE — Telephone Encounter (Signed)
Daughter called about the trandolapril (MAVIK) 2 MG tablet that her dad takes. There seems to be confusion on the SIG. Daughter said her dad had been taking 1/2 tab twice a day in conjunction with another med to make it work. Somehow, the way the last refill went, they only have enough to take 1/2 tab once a day. So pharmacy gave her 15 tabs for 30 days, which is not enough. Please clarify and call daughter.

## 2011-09-30 NOTE — Telephone Encounter (Signed)
error 

## 2011-10-20 ENCOUNTER — Ambulatory Visit: Payer: Medicare Other | Admitting: Internal Medicine

## 2011-10-27 ENCOUNTER — Ambulatory Visit (INDEPENDENT_AMBULATORY_CARE_PROVIDER_SITE_OTHER): Payer: Medicare Other | Admitting: Internal Medicine

## 2011-10-27 ENCOUNTER — Encounter: Payer: Self-pay | Admitting: Internal Medicine

## 2011-10-27 VITALS — BP 140/80 | HR 76 | Temp 98.2°F | Resp 18 | Ht 69.0 in | Wt 234.0 lb

## 2011-10-27 DIAGNOSIS — N138 Other obstructive and reflux uropathy: Secondary | ICD-10-CM

## 2011-10-27 DIAGNOSIS — L239 Allergic contact dermatitis, unspecified cause: Secondary | ICD-10-CM

## 2011-10-27 DIAGNOSIS — M1990 Unspecified osteoarthritis, unspecified site: Secondary | ICD-10-CM

## 2011-10-27 DIAGNOSIS — N401 Enlarged prostate with lower urinary tract symptoms: Secondary | ICD-10-CM

## 2011-10-27 DIAGNOSIS — N139 Obstructive and reflux uropathy, unspecified: Secondary | ICD-10-CM

## 2011-10-27 DIAGNOSIS — F0390 Unspecified dementia without behavioral disturbance: Secondary | ICD-10-CM

## 2011-10-27 DIAGNOSIS — L259 Unspecified contact dermatitis, unspecified cause: Secondary | ICD-10-CM

## 2011-10-27 DIAGNOSIS — I10 Essential (primary) hypertension: Secondary | ICD-10-CM

## 2011-10-27 MED ORDER — TERAZOSIN HCL 5 MG PO CAPS: 5.0000 mg | ORAL_CAPSULE | Freq: Every day | ORAL | Status: DC

## 2011-10-27 MED ORDER — TRAMADOL HCL 50 MG PO TABS: 25.0000 mg | ORAL_TABLET | Freq: Two times a day (BID) | ORAL | Status: DC

## 2011-10-27 MED ORDER — MEMANTINE HCL 10 MG PO TABS: 10.0000 mg | ORAL_TABLET | Freq: Two times a day (BID) | ORAL | Status: DC

## 2011-10-27 MED ORDER — DONEPEZIL HCL 10 MG PO TABS
10.0000 mg | ORAL_TABLET | Freq: Every day | ORAL | Status: DC
Start: 2011-10-27 — End: 2012-04-26

## 2011-10-27 MED ORDER — FUROSEMIDE 20 MG PO TABS: 20.0000 mg | ORAL_TABLET | Freq: Every day | ORAL | Status: DC

## 2011-10-27 MED ORDER — METHYLPREDNISOLONE (PAK) 4 MG PO TABS: ORAL_TABLET | ORAL | Status: AC

## 2011-10-27 MED ORDER — BETAMETHASONE DIPROPIONATE AUG 0.05 % EX OINT: TOPICAL_OINTMENT | Freq: Two times a day (BID) | CUTANEOUS | Status: AC

## 2011-10-27 MED ORDER — TRANDOLAPRIL 4 MG PO TABS: ORAL_TABLET | ORAL | Status: DC

## 2011-10-27 MED ORDER — VERAPAMIL HCL ER 240 MG PO TBCR: 240.0000 mg | EXTENDED_RELEASE_TABLET | Freq: Two times a day (BID) | ORAL | Status: DC

## 2011-10-27 NOTE — Progress Notes (Signed)
Subjective:    Patient ID: Glen Baldwin, male    DOB: 27-Mar-1925, 78 y.o.   MRN: [id number]  HPI Patient is an 78 year old male who presents for followup of high blood pressure COPD, memory disorder with an acute complaint of itchy dermatitis on his chest and arms.  There is no apparent rash although there is some erythema and scaling of the skin and excoriations from where he's been scratching the family relates that this began about 2 months ago and they cannot relate it to any new medication new detergent or new soAP   Review of Systems  Constitutional: Negative for fever and fatigue.  HENT: Negative for hearing loss, congestion, neck pain and postnasal drip.   Eyes: Negative for discharge, redness and visual disturbance.  Respiratory: Negative for cough, shortness of breath and wheezing.   Cardiovascular: Negative for leg swelling.  Gastrointestinal: Negative for abdominal pain, constipation and abdominal distention.  Genitourinary: Negative for urgency and frequency.  Musculoskeletal: Positive for joint swelling and gait problem. Negative for arthralgias.  Skin: Positive for rash. Negative for color change.  Neurological: Positive for weakness. Negative for light-headedness.  Hematological: Negative for adenopathy.  Psychiatric/Behavioral: Negative for behavioral problems.   Past Medical History  Diagnosis Date  . Hypertension   . COPD (chronic obstructive pulmonary disease)   . Arthritis   . BPH (benign prostatic hyperplasia)   . Bursitis of shoulder   . Bradycardia     History   Social History  . Marital Status: Married    Spouse Name: N/A    Number of Children: N/A  . Years of Education: N/A   Occupational History  . Not on file.   Social History Main Topics  . Smoking status: Former Games developer  . Smokeless tobacco: Not on file  . Alcohol Use: No  . Drug Use:   . Sexually Active: Not Currently   Other Topics Concern  . Not on file   Social History Narrative    . No narrative on file    Past Surgical History  Procedure Date  . Total knee arthroplasty   . Lumbar laminectomy     No family history on file.  No Known Allergies  Current Outpatient Prescriptions on File Prior to Visit  Medication Sig Dispense Refill  . Multiple Vitamin (MULTIVITAMIN) tablet Take 1 tablet by mouth daily.          BP 140/80  Pulse 76  Temp 98.2 F (36.8 C)  Resp 18  Ht 5\' 9"  (1.753 m)  Wt 234 lb (106.142 kg)  BMI 34.56 kg/m2       Objective:   Physical Exam  Nursing note and vitals reviewed. Constitutional: He appears well-developed and well-nourished.  HENT:  Head: Normocephalic and atraumatic.  Eyes: Conjunctivae are normal. Pupils are equal, round, and reactive to light.  Neck: Normal range of motion. Neck supple.  Cardiovascular: Normal rate and regular rhythm.   Pulmonary/Chest: Effort normal and breath sounds normal.  Abdominal: Soft. Bowel sounds are normal.  Neurological:       Significant memory change  Skin: Rash noted.          Assessment & Plan:  The patient has probable atopic dermatitis we'll use a combination of betamethasone and Sarna lotion applied twice daily to the side and limited potential allergens from detergents that he is exposed to as well as change his daily soap to [place].  Gross was problem list he stable his blood pressure stable number disorder stable  his COPD is stable and we have refilled his medications we will see him back in 6 months

## 2011-10-27 NOTE — Patient Instructions (Addendum)
The patient is instructed to continue all medications as prescribed. Schedule followup with check out clerk upon leaving the clinic   The patient should use only  dove with soap  Use Tide free and and use downy free  I going to give him a course of prednisone and to give you a mixture for cream to apply  Mix Sarna over-the-counter lotion 50-50 with the steroid lotion that I'm going to prescribe and apply it twice a day

## 2012-04-26 ENCOUNTER — Encounter: Payer: Self-pay | Admitting: Internal Medicine

## 2012-04-26 ENCOUNTER — Ambulatory Visit (INDEPENDENT_AMBULATORY_CARE_PROVIDER_SITE_OTHER): Payer: Medicare Other | Admitting: Internal Medicine

## 2012-04-26 ENCOUNTER — Ambulatory Visit: Payer: Medicare Other | Admitting: Internal Medicine

## 2012-04-26 VITALS — BP 140/90 | HR 76 | Temp 98.2°F | Resp 16 | Ht 69.0 in | Wt 228.0 lb

## 2012-04-26 DIAGNOSIS — N139 Obstructive and reflux uropathy, unspecified: Secondary | ICD-10-CM

## 2012-04-26 DIAGNOSIS — F0390 Unspecified dementia without behavioral disturbance: Secondary | ICD-10-CM

## 2012-04-26 DIAGNOSIS — N401 Enlarged prostate with lower urinary tract symptoms: Secondary | ICD-10-CM

## 2012-04-26 DIAGNOSIS — I10 Essential (primary) hypertension: Secondary | ICD-10-CM

## 2012-04-26 DIAGNOSIS — T887XXA Unspecified adverse effect of drug or medicament, initial encounter: Secondary | ICD-10-CM

## 2012-04-26 DIAGNOSIS — N138 Other obstructive and reflux uropathy: Secondary | ICD-10-CM

## 2012-04-26 LAB — CBC WITH DIFFERENTIAL/PLATELET
Basophils Relative: 0.4 % (ref 0.0–3.0)
Eosinophils Absolute: 0.1 10*3/uL (ref 0.0–0.7)
HCT: 39.4 % (ref 39.0–52.0)
Lymphs Abs: 2.1 10*3/uL (ref 0.7–4.0)
MCHC: 32.7 g/dL (ref 30.0–36.0)
MCV: 98.3 fl (ref 78.0–100.0)
Monocytes Absolute: 0.7 10*3/uL (ref 0.1–1.0)
Neutro Abs: 5 10*3/uL (ref 1.4–7.7)

## 2012-04-26 LAB — HEPATIC FUNCTION PANEL
ALT: 14 U/L (ref 0–53)
Albumin: 3.9 g/dL (ref 3.5–5.2)
Bilirubin, Direct: 0 mg/dL (ref 0.0–0.3)
Total Protein: 6.8 g/dL (ref 6.0–8.3)

## 2012-04-26 LAB — BASIC METABOLIC PANEL
Creatinine, Ser: 1.3 mg/dL (ref 0.4–1.5)
Potassium: 4.6 mEq/L (ref 3.5–5.1)

## 2012-04-26 MED ORDER — TRANDOLAPRIL 4 MG PO TABS: ORAL_TABLET | ORAL | Status: DC

## 2012-04-26 MED ORDER — TERAZOSIN HCL 5 MG PO CAPS: 5.0000 mg | ORAL_CAPSULE | Freq: Every day | ORAL | Status: DC

## 2012-04-26 MED ORDER — VERAPAMIL HCL ER 240 MG PO TBCR: 240.0000 mg | EXTENDED_RELEASE_TABLET | Freq: Two times a day (BID) | ORAL | Status: DC

## 2012-04-26 MED ORDER — DONEPEZIL HCL 10 MG PO TABS: 10.0000 mg | ORAL_TABLET | Freq: Every day | ORAL | Status: DC

## 2012-04-26 MED ORDER — MEMANTINE HCL 10 MG PO TABS: 10.0000 mg | ORAL_TABLET | Freq: Two times a day (BID) | ORAL | Status: DC

## 2012-04-26 MED ORDER — FUROSEMIDE 20 MG PO TABS: 20.0000 mg | ORAL_TABLET | Freq: Every day | ORAL | Status: DC

## 2012-04-26 NOTE — Progress Notes (Signed)
Subjective:    Patient ID: Glen Baldwin, male    DOB: 12/10/1924, 78 y.o.   MRN: [id number]  HPI Glen Baldwin is an 78 year old male who is followed for hypertension history of COPD a history of benign static hypertrophy and a history of Alzheimer's type dementia.  Patient is doing stable on his blood pressure medications we have changed his blood pressure medications through a separate ACE inhibitor and a separate verapamil.  His weight is stable he has no signs of congestive heart failure he is taking his medicines for his memory disorder and his wife states that he is tolerating them well.  He is mild to moderate progression of disease which is slow and insidious onset.      Review of Systems  Constitutional: Negative for fever and fatigue.  HENT: Negative for hearing loss, congestion, neck pain and postnasal drip.   Eyes: Negative for discharge, redness and visual disturbance.  Respiratory: Negative for cough, shortness of breath and wheezing.   Cardiovascular: Negative for leg swelling.  Gastrointestinal: Negative for abdominal pain, constipation and abdominal distention.  Genitourinary: Negative for urgency and frequency.  Musculoskeletal: Negative for joint swelling and arthralgias.  Skin: Negative for color change and rash.  Neurological: Negative for weakness and light-headedness.  Hematological: Negative for adenopathy.  Psychiatric/Behavioral: Negative for behavioral problems.   Past Medical History  Diagnosis Date  . Hypertension   . COPD (chronic obstructive pulmonary disease)   . Arthritis   . BPH (benign prostatic hyperplasia)   . Bursitis of shoulder   . Bradycardia     History   Social History  . Marital Status: Married    Spouse Name: N/A    Number of Children: N/A  . Years of Education: N/A   Occupational History  . Not on file.   Social History Main Topics  . Smoking status: Former Games developer  . Smokeless tobacco: Not on file  . Alcohol Use: No    . Drug Use:   . Sexually Active: Not Currently   Other Topics Concern  . Not on file   Social History Narrative  . No narrative on file    Past Surgical History  Procedure Date  . Total knee arthroplasty   . Lumbar laminectomy     No family history on file.  No Known Allergies  Current Outpatient Prescriptions on File Prior to Visit  Medication Sig Dispense Refill  . augmented betamethasone dipropionate (DIPROLENE) 0.05 % ointment Apply topically 2 (two) times daily.  50 g  3  . Multiple Vitamin (MULTIVITAMIN) tablet Take 1 tablet by mouth daily.        Marland Kitchen DISCONTD: donepezil (ARICEPT) 10 MG tablet Take 1 tablet (10 mg total) by mouth at bedtime.  90 tablet  3  . DISCONTD: furosemide (LASIX) 20 MG tablet Take 1 tablet (20 mg total) by mouth daily.  30 tablet  11  . DISCONTD: memantine (NAMENDA) 10 MG tablet Take 1 tablet (10 mg total) by mouth 2 (two) times daily.  180 tablet  3  . DISCONTD: terazosin (HYTRIN) 5 MG capsule Take 1 capsule (5 mg total) by mouth at bedtime.  90 capsule  3  . DISCONTD: trandolapril (MAVIK) 4 MG tablet 1/2 tab twice a day  30 tablet  11  . DISCONTD: verapamil (CALAN-SR) 240 MG CR tablet Take 1 tablet (240 mg total) by mouth 2 (two) times daily.  60 tablet  6    BP 140/90  Pulse 76  Temp 98.2  F (36.8 C)  Resp 16  Ht 5\' 9"  (1.753 m)  Wt 228 lb (103.42 kg)  BMI 33.67 kg/m2       Objective:   Physical Exam  Nursing note and vitals reviewed. Constitutional: He appears well-developed and well-nourished.  HENT:  Head: Normocephalic and atraumatic.  Eyes: Conjunctivae are normal. Pupils are equal, round, and reactive to light.  Neck: Normal range of motion. Neck supple.  Cardiovascular: Normal rate and regular rhythm.   Murmur heard. Pulmonary/Chest: Effort normal and breath sounds normal.  Abdominal: Soft. Bowel sounds are normal.  Musculoskeletal: He exhibits edema and tenderness.          Assessment & Plan:  Patient has stable  disease states of hypertension COPD benign prostatic hypertrophy and dementia.  No change in current medications are warranted however blood work today including a CBC differential a basic metabolic panel and a liver panel should be drawn to assess side effects of medications and stability.  NCB

## 2012-04-26 NOTE — Patient Instructions (Addendum)
The patient is instructed to continue all medications as prescribed. Schedule followup with check out clerk upon leaving the clinic  

## 2012-10-25 ENCOUNTER — Ambulatory Visit: Payer: Medicare Other | Admitting: Internal Medicine

## 2013-04-23 ENCOUNTER — Other Ambulatory Visit: Payer: Self-pay | Admitting: *Deleted

## 2013-04-23 DIAGNOSIS — N138 Other obstructive and reflux uropathy: Secondary | ICD-10-CM

## 2013-04-23 MED ORDER — TERAZOSIN HCL 5 MG PO CAPS: 5.0000 mg | ORAL_CAPSULE | Freq: Every day | ORAL | Status: DC

## 2013-04-25 ENCOUNTER — Other Ambulatory Visit: Payer: Self-pay | Admitting: *Deleted

## 2013-04-25 DIAGNOSIS — I10 Essential (primary) hypertension: Secondary | ICD-10-CM

## 2013-04-25 MED ORDER — TRANDOLAPRIL 4 MG PO TABS: ORAL_TABLET | ORAL | Status: DC

## 2013-05-25 ENCOUNTER — Other Ambulatory Visit: Payer: Self-pay | Admitting: *Deleted

## 2013-05-25 DIAGNOSIS — I10 Essential (primary) hypertension: Secondary | ICD-10-CM

## 2013-05-25 MED ORDER — FUROSEMIDE 20 MG PO TABS: 20.0000 mg | ORAL_TABLET | Freq: Every day | ORAL | Status: DC

## 2013-06-05 ENCOUNTER — Other Ambulatory Visit: Payer: Self-pay | Admitting: *Deleted

## 2013-06-05 DIAGNOSIS — I10 Essential (primary) hypertension: Secondary | ICD-10-CM

## 2013-06-05 DIAGNOSIS — F0390 Unspecified dementia without behavioral disturbance: Secondary | ICD-10-CM

## 2013-06-05 MED ORDER — VERAPAMIL HCL ER 240 MG PO TBCR: 240.0000 mg | EXTENDED_RELEASE_TABLET | Freq: Two times a day (BID) | ORAL | Status: DC

## 2013-06-05 MED ORDER — MEMANTINE HCL 10 MG PO TABS: 10.0000 mg | ORAL_TABLET | Freq: Two times a day (BID) | ORAL | Status: DC

## 2013-07-13 ENCOUNTER — Other Ambulatory Visit: Payer: Self-pay | Admitting: *Deleted

## 2013-07-13 DIAGNOSIS — F0390 Unspecified dementia without behavioral disturbance: Secondary | ICD-10-CM

## 2013-07-13 MED ORDER — DONEPEZIL HCL 10 MG PO TABS: 10.0000 mg | ORAL_TABLET | Freq: Every day | ORAL | Status: DC

## 2014-03-18 ENCOUNTER — Telehealth: Payer: Self-pay | Admitting: Internal Medicine

## 2014-03-18 NOTE — Telephone Encounter (Signed)
Pt daughter call want a letter from Dr.Jenkins stating he is not able to handle his affairs. Please give her a call back at this # [phone].  Thanks

## 2014-03-18 NOTE — Telephone Encounter (Signed)
The legal system requires that we document this in a face to face. He could see Matt or Dr Durene CalHunter to get this

## 2014-03-19 NOTE — Telephone Encounter (Signed)
Pt's daughter will discuss with pt and his wife and call back if they decide to schedule an appt

## 2014-03-19 NOTE — Telephone Encounter (Signed)
Left message for daughter to call back.  

## 2014-05-20 ENCOUNTER — Telehealth: Payer: Self-pay | Admitting: Internal Medicine

## 2014-05-20 NOTE — Telephone Encounter (Signed)
I am happy to see him, but unfortunately the next new patient visits are a long way off . There may be sooner openings with Dr. Pricilla Holm?

## 2014-05-20 NOTE — Telephone Encounter (Signed)
Pt daughter would like dr kim to accept her dad . Pt has dementia. Can I sch?

## 2014-05-22 NOTE — Telephone Encounter (Signed)
Pt daughter will wait for dr kim on 10-07-14

## 2014-06-19 ENCOUNTER — Telehealth: Payer: Self-pay | Admitting: Internal Medicine

## 2014-06-19 DIAGNOSIS — N401 Enlarged prostate with lower urinary tract symptoms: Principal | ICD-10-CM

## 2014-06-19 DIAGNOSIS — N138 Other obstructive and reflux uropathy: Secondary | ICD-10-CM

## 2014-06-19 NOTE — Telephone Encounter (Signed)
Please add terazosin (HYTRIN) 5 MG capsule to re-fill request below

## 2014-06-19 NOTE — Telephone Encounter (Signed)
[facility] - [place] - [address] Is requesting re-fill on memantine (NAMENDA) 10 MG tablet

## 2014-06-20 MED ORDER — TERAZOSIN HCL 5 MG PO CAPS: 5.0000 mg | ORAL_CAPSULE | Freq: Every day | ORAL | Status: DC

## 2014-06-20 MED ORDER — MEMANTINE HCL 10 MG PO TABS: 10.0000 mg | ORAL_TABLET | Freq: Two times a day (BID) | ORAL | Status: DC

## 2014-06-20 NOTE — Telephone Encounter (Signed)
Rxs done and I called the pt and informed his wife of the message below.

## 2014-06-20 NOTE — Telephone Encounter (Signed)
Ok to refill only to get to appointment and must keep appt as not seen in a long time and I have never seen him. Thanks.

## 2014-07-24 ENCOUNTER — Telehealth: Payer: Self-pay | Admitting: Internal Medicine

## 2014-07-24 DIAGNOSIS — I10 Essential (primary) hypertension: Secondary | ICD-10-CM

## 2014-07-24 DIAGNOSIS — F0390 Unspecified dementia without behavioral disturbance: Secondary | ICD-10-CM

## 2014-07-24 NOTE — Telephone Encounter (Signed)
[facility] - [place], [place] - [address] [phone] is requesting re-fill on furosemide (LASIX) 20 MG tablet and donepezil (ARICEPT) 10 MG tablet

## 2014-07-25 NOTE — Telephone Encounter (Signed)
Pt needs to be seen for an appt for med check.  He has not been seen since 2013

## 2014-07-26 NOTE — Telephone Encounter (Signed)
Unable to reach patient. Will call back later

## 2014-07-26 NOTE — Telephone Encounter (Signed)
I called patient and his wife said that I couldn't speak with him and to talk to her.  Patient's wife states he doesn't need to be seen right now and just needs the medication filled.  She is requesting to speak to you about this re-fill request. She said patient has dementia.

## 2014-07-30 MED ORDER — FUROSEMIDE 20 MG PO TABS: 20.0000 mg | ORAL_TABLET | Freq: Every day | ORAL | Status: DC

## 2014-07-30 MED ORDER — DONEPEZIL HCL 10 MG PO TABS: 10.0000 mg | ORAL_TABLET | Freq: Every day | ORAL | Status: DC

## 2014-07-30 NOTE — Telephone Encounter (Signed)
Daughter called back saying pt had appt with Dr Durene CalHunter in February.  She would like enough of refills to get pt till his appt.  She states its difficult to get patient in because of dementia.  Explained to daughter that pt has to be seen at least once a year per medical board guidelines.  She wanted me to talk to Dr Lovell SheehanJenkins and see if Dr Lovell SheehanJenkins would give pt enough refills till appt.  Called Dr Lovell SheehanJenkins and per Dr Lovell SheehanJenkins okay to refill till February but if pt cancels appt with Dr Durene CalHunter, he will not authorize anymore.  Pt's daughter aware.  Rx sent in electronically to [facility]

## 2014-10-07 ENCOUNTER — Encounter: Payer: Self-pay | Admitting: Family Medicine

## 2014-10-07 ENCOUNTER — Ambulatory Visit (INDEPENDENT_AMBULATORY_CARE_PROVIDER_SITE_OTHER): Payer: Medicare Other | Admitting: Family Medicine

## 2014-10-07 VITALS — BP 112/80 | HR 57 | Ht 69.0 in | Wt 230.3 lb

## 2014-10-07 DIAGNOSIS — I10 Essential (primary) hypertension: Secondary | ICD-10-CM | POA: Diagnosis not present

## 2014-10-07 DIAGNOSIS — R413 Other amnesia: Secondary | ICD-10-CM

## 2014-10-07 DIAGNOSIS — F0280 Dementia in other diseases classified elsewhere without behavioral disturbance: Secondary | ICD-10-CM

## 2014-10-07 DIAGNOSIS — G309 Alzheimer's disease, unspecified: Secondary | ICD-10-CM

## 2014-10-07 DIAGNOSIS — N138 Other obstructive and reflux uropathy: Secondary | ICD-10-CM

## 2014-10-07 DIAGNOSIS — Z23 Encounter for immunization: Secondary | ICD-10-CM | POA: Diagnosis not present

## 2014-10-07 DIAGNOSIS — N401 Enlarged prostate with lower urinary tract symptoms: Secondary | ICD-10-CM

## 2014-10-07 DIAGNOSIS — F0390 Unspecified dementia without behavioral disturbance: Secondary | ICD-10-CM | POA: Diagnosis not present

## 2014-10-07 NOTE — Progress Notes (Signed)
Pre visit review using our clinic review tool, if applicable. No additional management support is needed unless otherwise documented below in the visit note. 

## 2014-10-07 NOTE — Addendum Note (Signed)
Addended by: Johnella MoloneyFUNDERBURK, Cedar Roseman A on: 10/07/2014 12:28 PM   Modules accepted: Orders

## 2014-10-07 NOTE — Progress Notes (Signed)
Reports DNR/DNI

## 2014-10-07 NOTE — Progress Notes (Signed)
HPI:  Glen Baldwin is here to establish care. He used to see Dr. Lovell Baldwin. He has not had labs in several years. He is here with his wife and his daughter.  Has the following chronic problems that require follow up and concerns today:  HTN/LE edema: -meds: lasix  daily, terazosin  and trandolapril , verapamil  from his prior PCP -wife only gives him his BP medications avery other day -denies: CP, SOB, swelling  BPH: -meds: terazosin   Alzheimer's dementia: -meds: aricept and namenda from prior PCP -reports: wife reports he stays home and is well cared for - gets out with his sons whom ar in florist business -he lives with his wife whom is taking care of him - children work next door and are involved -denies: behavioral issues, falls -is incontinent -DNR/DNI per daughter and wife -they do not want hospital care if he sick  ROS negative for unless reported above: fevers, unintentional weight loss, hearing or vision loss, chest pain, palpitations, struggling to breath, hemoptysis, melena, hematochezia, hematuria, falls, loc, si, thoughts of self harm  Past Medical History  Diagnosis Date  . Hypertension   . COPD (chronic obstructive pulmonary disease)   . Arthritis   . BPH (benign prostatic hyperplasia)   . Bursitis of shoulder   . Bradycardia     Past Surgical History  Procedure Laterality Date  . Total knee arthroplasty    . Lumbar laminectomy      No family history on file.  History   Social History  . Marital Status: Married    Spouse Name: N/A    Number of Children: N/A  . Years of Education: N/A   Social History Main Topics  . Smoking status: Former Games developer  . Smokeless tobacco: None  . Alcohol Use: No  . Drug Use: None  . Sexual Activity: Not Currently   Other Topics Concern  . None   Social History Narrative     Current outpatient prescriptions:  .  Diphenhydramine-APAP, sleep, (ACETAMINOPHEN PM PO), Take by mouth at  bedtime., Disp: , Rfl:  .  donepezil (ARICEPT) 10 MG tablet, Take 1 tablet (10 mg total) by mouth at bedtime., Disp: 90 tablet, Rfl: 0 .  furosemide (LASIX) 20 MG tablet, Take 1 tablet (20 mg total) by mouth daily., Disp: 90 tablet, Rfl: 0 .  loperamide (IMODIUM) 2 MG capsule, Take by mouth daily., Disp: , Rfl:  .  memantine (NAMENDA) 10 MG tablet, Take 1 tablet (10 mg total) by mouth 2 (two) times daily., Disp: 180 tablet, Rfl: 1 .  terazosin (HYTRIN) 5 MG capsule, Take 1 capsule (5 mg total) by mouth at bedtime., Disp: 90 capsule, Rfl: 1 .  trandolapril (MAVIK) 4 MG tablet, 1/2 tab twice a day (Patient taking differently: 1/2 tab every other day), Disp: 90 tablet, Rfl: 3 .  verapamil (CALAN-SR) 240 MG CR tablet, Take 1 tablet (240 mg total) by mouth 2 (two) times daily. (Patient taking differently: Take 240 mg by mouth 2 (two) times daily. Take 1 every other day), Disp: 180 tablet, Rfl: 3  EXAM:  Filed Vitals:   10/07/14 1142  BP: 112/80  Pulse: 57    Body mass index is 33.99 kg/(m^2).  GENERAL: vitals reviewed and listed above, alert, oriented, appears well hydrated and in no acute distress  HEENT: atraumatic, conjunttiva clear, no obvious abnormalities on inspection of external nose and ears  NECK: no obvious masses on inspection  LUNGS: clear to auscultation bilaterally, no  wheezes, rales or rhonchi, good air movement  CV: HRRR, no peripheral edema  MS: moves all extremities without noticeable abnormality  PSYCH: pleasant and cooperative, no obvious depression or anxiety  ASSESSMENT AND PLAN:  Discussed the following assessment and plan:  Alzheimer's dementia  Memory loss  Essential hypertension  -We reviewed the PMH, PSH, FH, SH, Meds and Allergies. -We provided refills for any medications we will prescribe as needed. -We addressed current concerns per orders and patient instructions. -We have asked for records for pertinent exams, studies, vaccines and notes from  previous providers. -We have advised patient to follow up per instructions below. -daughter and wife are in agreement they want to continue with comfort care only for Mr. Glen Baldwin - they would prefer to continue current medication but do not want labs or invasive treatments -they report if he were to worsen or get sicker at any point they prefer care at home and hospice when that time comes -continue current med, they did agree to vaccines, follow up prn and yearly   -Patient advised to return or notify a doctor immediately if symptoms worsen or persist or new concerns arise.  There are no Patient Instructions on file for this visit.   Glen Baldwin, Glen Baldwin.

## 2014-10-08 ENCOUNTER — Telehealth: Payer: Self-pay | Admitting: Family Medicine

## 2014-10-08 NOTE — Telephone Encounter (Signed)
emmi mailed  °

## 2014-10-09 MED ORDER — FUROSEMIDE 20 MG PO TABS: 20.0000 mg | ORAL_TABLET | Freq: Every day | ORAL | Status: DC

## 2014-10-09 MED ORDER — TRANDOLAPRIL 4 MG PO TABS: ORAL_TABLET | ORAL | Status: DC

## 2014-10-09 MED ORDER — DONEPEZIL HCL 10 MG PO TABS: 10.0000 mg | ORAL_TABLET | Freq: Every day | ORAL | Status: DC

## 2014-10-09 MED ORDER — VERAPAMIL HCL ER 240 MG PO TBCR: 240.0000 mg | EXTENDED_RELEASE_TABLET | Freq: Two times a day (BID) | ORAL | Status: DC

## 2014-10-09 MED ORDER — MEMANTINE HCL 10 MG PO TABS: 10.0000 mg | ORAL_TABLET | Freq: Two times a day (BID) | ORAL | Status: DC

## 2014-10-09 MED ORDER — TERAZOSIN HCL 5 MG PO CAPS: 5.0000 mg | ORAL_CAPSULE | Freq: Every day | ORAL | Status: DC

## 2014-10-09 NOTE — Addendum Note (Signed)
Addended by: Terressa KoyanagiKIM, HANNAH R on: 10/09/2014 11:05 AM   Modules accepted: Orders

## 2015-01-22 ENCOUNTER — Other Ambulatory Visit: Payer: Self-pay | Admitting: Family Medicine

## 2015-06-02 ENCOUNTER — Telehealth: Payer: Self-pay | Admitting: Family Medicine

## 2015-06-02 NOTE — Telephone Encounter (Signed)
I called the pts daughter and informed her the message below and she was transferred to [facility] to schedule an appt for the pt.

## 2015-06-02 NOTE — Telephone Encounter (Signed)
Needs appt

## 2015-06-02 NOTE — Telephone Encounter (Signed)
Pt last seen md on 10-07-14 and would like something call into [facility] to help calm him down and sleep. Pt has dementia

## 2015-06-03 ENCOUNTER — Ambulatory Visit (INDEPENDENT_AMBULATORY_CARE_PROVIDER_SITE_OTHER): Payer: Medicare Other | Admitting: Family Medicine

## 2015-06-03 ENCOUNTER — Encounter: Payer: Self-pay | Admitting: Family Medicine

## 2015-06-03 VITALS — BP 120/80

## 2015-06-03 DIAGNOSIS — Z23 Encounter for immunization: Secondary | ICD-10-CM

## 2015-06-03 DIAGNOSIS — F0390 Unspecified dementia without behavioral disturbance: Secondary | ICD-10-CM | POA: Diagnosis not present

## 2015-06-03 DIAGNOSIS — F03C0 Unspecified dementia, severe, without behavioral disturbance, psychotic disturbance, mood disturbance, and anxiety: Secondary | ICD-10-CM

## 2015-06-03 MED ORDER — CITALOPRAM HYDROBROMIDE 10 MG PO TABS: 10.0000 mg | ORAL_TABLET | Freq: Every day | ORAL | Status: DC

## 2015-06-03 NOTE — Progress Notes (Addendum)
HPI:  Glen Baldwin is an 81 yo M patient whom I have seen only once with a hx of significant dementia. On their initial visit with me the family requested primarily comfort measures for him and he is DNR/DNI. He has many family members that are very involved in caring for him and has constant supervision. He is on aricept and namenda from his prior PCP. They avoid labs, invasive procedures, most preventive measures and medications except as he wishes. He has longstanding behavioral issues, falls and incontinence. They do not wish for any hospital care should he become very sick. They reconfirm these feelings today. They feel his mood fluctuations and behavior are worsening and he has poor and restless sleep. He is not aggressive and they feel safe. Declined vitals except for BP. On further questioning he is not really taking any of his medications anymore as he spits them out.   ROS: See pertinent positives and negatives per HPI.  Past Medical History  Diagnosis Date  . Hypertension   . COPD (chronic obstructive pulmonary disease)   . Arthritis   . BPH (benign prostatic hyperplasia)   . Bursitis of shoulder   . Bradycardia     Past Surgical History  Procedure Laterality Date  . Total knee arthroplasty    . Lumbar laminectomy      No family history on file.  Social History   Social History  . Marital Status: Married    Spouse Name: N/A  . Number of Children: N/A  . Years of Education: N/A   Social History Main Topics  . Smoking status: Former Games developer  . Smokeless tobacco: None  . Alcohol Use: No  . Drug Use: None  . Sexual Activity: Not Currently   Other Topics Concern  . None   Social History Narrative     Current outpatient prescriptions:  .  citalopram (CELEXA) 10 MG tablet, Take 1 tablet (10 mg total) by mouth daily., Disp: 30 tablet, Rfl: 11 .  Diphenhydramine-APAP, sleep, (ACETAMINOPHEN PM PO), Take by mouth at bedtime., Disp: , Rfl:  .  donepezil (ARICEPT) 10  MG tablet, Take 1 tablet (10 mg total) by mouth at bedtime., Disp: 90 tablet, Rfl: 0 .  furosemide (LASIX) 20 MG tablet, TAKE 1 TABLET BY MOUTH ONCE DAILY, Disp: 90 tablet, Rfl: 2 .  loperamide (IMODIUM) 2 MG capsule, Take by mouth daily., Disp: , Rfl:  .  memantine (NAMENDA) 10 MG tablet, Take 1 tablet (10 mg total) by mouth 2 (two) times daily., Disp: 180 tablet, Rfl: 3 .  terazosin (HYTRIN) 5 MG capsule, Take 1 capsule (5 mg total) by mouth at bedtime., Disp: 90 capsule, Rfl: 3 .  trandolapril (MAVIK) 4 MG tablet, 1/2 tab twice a day, Disp: 90 tablet, Rfl: 3 .  verapamil (CALAN-SR) 240 MG CR tablet, Take 1 tablet (240 mg total) by mouth 2 (two) times daily., Disp: 180 tablet, Rfl: 3  EXAM:  Filed Vitals:   06/03/15 1415  BP: 120/80    There is no weight on file to calculate BMI. Vitals declined except for BP  GENERAL: vitals reviewed and listed above, alert, oriented, appears well hydrated and in no acute distress  HEENT: atraumatic, conjunttiva clear, no obvious abnormalities on inspection of external nose and ears  NECK: no obvious masses on inspection  LUNGS: clear to auscultation bilaterally, no wheezes, rales or rhonchi, good air movement  CV: HRRR, no peripheral edema  MS: moves all extremities without noticeable abnormality  PSYCH:  pleasant and cooperative, no obvious depression or anxiety  ASSESSMENT AND PLAN:  Discussed the following assessment and plan:  Severe dementia  Need for prophylactic vaccination and inoculation against influenza - Plan: Flu vaccine HIGH DOSE PF (Fluzone High dose)  -they are not intersted in a SNF or dementia unit at this time but may consider and visit -we reviewed the current data on tx of behavioral issues and sleep in dementia and risks.  -would suggest non-pharmacological options for sleep per current standard of care given no data supports medications for this. -for the mood issues opted to try citalopram low dose -trial put  medications in pudding; his BP is good off medications and they wish to only do his dementia and mood medications -resources for dementia and hospice discussed -advise 24 hour supervision -Patient advised to return or notify a doctor immediately if symptoms worsen or persist or new concerns arise.  Patient Instructions  Please try giving medications in pudding  The medications for dementia are aricept and namenda  The medication for mood is citalopram - this should be taken every day  Consider a caregiver at times to give you a break   Consider hospice - [facility] is a good option     KIM, [employer].

## 2015-06-03 NOTE — Addendum Note (Signed)
Addended by: Griselda Miner E on: 06/03/2015 02:17 PM   Modules accepted: Orders, Medications

## 2015-06-03 NOTE — Patient Instructions (Signed)
Please try giving medications in pudding  The medications for dementia are aricept and namenda  The medication for mood is citalopram - this should be taken every day  Consider a caregiver at times to give you a break   Consider hospice - hospice and palliative [facility] of [facility] is a good option

## 2015-06-19 ENCOUNTER — Telehealth: Payer: Self-pay | Admitting: Family Medicine

## 2015-06-19 NOTE — Telephone Encounter (Signed)
Dr Selena BattenKim completed the form and this was faxed to [phone].

## 2015-06-19 NOTE — Telephone Encounter (Signed)
Glen Baldwin is needing verbal order for Hospice to go out to do an assement and would like for Dr. Selena BattenKim to follow Glen Baldwin and their Doctors are willing to do symptom management.

## 2015-06-19 NOTE — Telephone Encounter (Signed)
Pt daughter called and said Hospice need to speak with Dr Selena BattenKim today  [phone]   Bronson IngYvette at hospice

## 2015-06-23 ENCOUNTER — Emergency Department (HOSPITAL_COMMUNITY): Payer: Medicare Other

## 2015-06-23 ENCOUNTER — Observation Stay (HOSPITAL_COMMUNITY)
Admission: EM | Admit: 2015-06-23 | Discharge: 2015-06-24 | Disposition: A | Discharge status: Home / Self Care | Payer: Medicare Other | Attending: Internal Medicine | Admitting: Internal Medicine

## 2015-06-23 ENCOUNTER — Inpatient Hospital Stay (HOSPITAL_COMMUNITY): Payer: Medicare Other

## 2015-06-23 ENCOUNTER — Encounter (HOSPITAL_COMMUNITY): Payer: Self-pay | Admitting: *Deleted

## 2015-06-23 ENCOUNTER — Telehealth: Payer: Self-pay | Admitting: *Deleted

## 2015-06-23 DIAGNOSIS — N179 Acute kidney failure, unspecified: Secondary | ICD-10-CM | POA: Diagnosis not present

## 2015-06-23 DIAGNOSIS — F0280 Dementia in other diseases classified elsewhere without behavioral disturbance: Secondary | ICD-10-CM | POA: Insufficient documentation

## 2015-06-23 DIAGNOSIS — R0602 Shortness of breath: Secondary | ICD-10-CM | POA: Diagnosis not present

## 2015-06-23 DIAGNOSIS — Z515 Encounter for palliative care: Secondary | ICD-10-CM | POA: Insufficient documentation

## 2015-06-23 DIAGNOSIS — E875 Hyperkalemia: Secondary | ICD-10-CM | POA: Insufficient documentation

## 2015-06-23 DIAGNOSIS — M7989 Other specified soft tissue disorders: Secondary | ICD-10-CM

## 2015-06-23 DIAGNOSIS — J189 Pneumonia, unspecified organism: Secondary | ICD-10-CM | POA: Diagnosis not present

## 2015-06-23 DIAGNOSIS — J449 Chronic obstructive pulmonary disease, unspecified: Secondary | ICD-10-CM | POA: Diagnosis not present

## 2015-06-23 DIAGNOSIS — Z87891 Personal history of nicotine dependence: Secondary | ICD-10-CM | POA: Diagnosis not present

## 2015-06-23 DIAGNOSIS — Z66 Do not resuscitate: Secondary | ICD-10-CM | POA: Insufficient documentation

## 2015-06-23 DIAGNOSIS — M1990 Unspecified osteoarthritis, unspecified site: Secondary | ICD-10-CM | POA: Diagnosis not present

## 2015-06-23 DIAGNOSIS — A419 Sepsis, unspecified organism: Secondary | ICD-10-CM | POA: Diagnosis not present

## 2015-06-23 DIAGNOSIS — N12 Tubulo-interstitial nephritis, not specified as acute or chronic: Secondary | ICD-10-CM | POA: Insufficient documentation

## 2015-06-23 DIAGNOSIS — R001 Bradycardia, unspecified: Secondary | ICD-10-CM | POA: Insufficient documentation

## 2015-06-23 DIAGNOSIS — N400 Enlarged prostate without lower urinary tract symptoms: Secondary | ICD-10-CM | POA: Insufficient documentation

## 2015-06-23 DIAGNOSIS — Z79899 Other long term (current) drug therapy: Secondary | ICD-10-CM | POA: Insufficient documentation

## 2015-06-23 DIAGNOSIS — I10 Essential (primary) hypertension: Secondary | ICD-10-CM | POA: Diagnosis not present

## 2015-06-23 DIAGNOSIS — R2242 Localized swelling, mass and lump, left lower limb: Secondary | ICD-10-CM | POA: Diagnosis not present

## 2015-06-23 DIAGNOSIS — G309 Alzheimer's disease, unspecified: Secondary | ICD-10-CM | POA: Insufficient documentation

## 2015-06-23 DIAGNOSIS — R600 Localized edema: Secondary | ICD-10-CM | POA: Diagnosis not present

## 2015-06-23 DIAGNOSIS — Z96659 Presence of unspecified artificial knee joint: Secondary | ICD-10-CM | POA: Insufficient documentation

## 2015-06-23 DIAGNOSIS — R6889 Other general symptoms and signs: Secondary | ICD-10-CM | POA: Diagnosis not present

## 2015-06-23 DIAGNOSIS — M79606 Pain in leg, unspecified: Secondary | ICD-10-CM | POA: Diagnosis not present

## 2015-06-23 HISTORY — DX: Unspecified dementia, unspecified severity, without behavioral disturbance, psychotic disturbance, mood disturbance, and anxiety: F03.90

## 2015-06-23 LAB — CBC WITH DIFFERENTIAL/PLATELET
BASOS ABS: 0 10*3/uL (ref 0.0–0.1)
BASOS PCT: 0 %
EOS ABS: 0 10*3/uL (ref 0.0–0.7)
EOS PCT: 0 %
HCT: 48.4 % (ref 39.0–52.0)
Hemoglobin: 16.5 g/dL (ref 13.0–17.0)
LYMPHS ABS: 1.8 10*3/uL (ref 0.7–4.0)
Lymphocytes Relative: 8 %
MCH: 31.5 pg (ref 26.0–34.0)
MCHC: 34.1 g/dL (ref 30.0–36.0)
MCV: 92.5 fL (ref 78.0–100.0)
Monocytes Absolute: 1.5 10*3/uL — ABNORMAL HIGH (ref 0.1–1.0)
Monocytes Relative: 7 %
NEUTROS PCT: 85 %
Neutro Abs: 19.1 10*3/uL — ABNORMAL HIGH (ref 1.7–7.7)
PLATELETS: 65 10*3/uL — AB (ref 150–400)
RBC: 5.23 MIL/uL (ref 4.22–5.81)
RDW: 16 % — AB (ref 11.5–15.5)
WBC: 22.4 10*3/uL — AB (ref 4.0–10.5)

## 2015-06-23 LAB — COMPREHENSIVE METABOLIC PANEL
ALK PHOS: 100 U/L (ref 38–126)
ALT: 31 U/L (ref 17–63)
AST: 43 U/L — ABNORMAL HIGH (ref 15–41)
Albumin: 2.8 g/dL — ABNORMAL LOW (ref 3.5–5.0)
Anion gap: 22 — ABNORMAL HIGH (ref 5–15)
BILIRUBIN TOTAL: 2.2 mg/dL — AB (ref 0.3–1.2)
BUN: 165 mg/dL — AB (ref 6–20)
CALCIUM: 8.4 mg/dL — AB (ref 8.9–10.3)
CHLORIDE: 106 mmol/L (ref 101–111)
CO2: 13 mmol/L — AB (ref 22–32)
CREATININE: 12.89 mg/dL — AB (ref 0.61–1.24)
GFR calc Af Amer: 3 mL/min — ABNORMAL LOW (ref >60)
GFR calc non Af Amer: 3 mL/min — ABNORMAL LOW (ref >60)
GLUCOSE: 142 mg/dL — AB (ref 65–99)
Potassium: 7.5 mmol/L (ref 3.5–5.1)
SODIUM: 141 mmol/L (ref 135–145)
Total Protein: 6.6 g/dL (ref 6.5–8.1)

## 2015-06-23 LAB — URINE MICROSCOPIC-ADD ON

## 2015-06-23 LAB — URINALYSIS, ROUTINE W REFLEX MICROSCOPIC
GLUCOSE, UA: NEGATIVE mg/dL
KETONES UR: NEGATIVE mg/dL
NITRITE: POSITIVE — AB
Protein, ur: NEGATIVE mg/dL
SPECIFIC GRAVITY, URINE: 1.016 (ref 1.005–1.030)
Urobilinogen, UA: 0.2 mg/dL (ref 0.0–1.0)
pH: 7 (ref 5.0–8.0)

## 2015-06-23 LAB — I-STAT CHEM 8, ED
CALCIUM ION: 0.88 mmol/L — AB (ref 1.13–1.30)
CHLORIDE: 114 mmol/L — AB (ref 101–111)
CREATININE: 12.8 mg/dL — AB (ref 0.61–1.24)
GLUCOSE: 141 mg/dL — AB (ref 65–99)
HCT: 50 % (ref 39.0–52.0)
Hemoglobin: 17 g/dL (ref 13.0–17.0)
POTASSIUM: 7.1 mmol/L — AB (ref 3.5–5.1)
Sodium: 137 mmol/L (ref 135–145)
TCO2: 12 mmol/L (ref 0–100)

## 2015-06-23 LAB — I-STAT TROPONIN, ED: TROPONIN I, POC: 0.51 ng/mL — AB (ref 0.00–0.08)

## 2015-06-23 LAB — I-STAT CG4 LACTIC ACID, ED: LACTIC ACID, VENOUS: 2.19 mmol/L — AB (ref 0.5–2.0)

## 2015-06-23 LAB — LIPASE, BLOOD: Lipase: 78 U/L — ABNORMAL HIGH (ref 22–51)

## 2015-06-23 LAB — D-DIMER, QUANTITATIVE (NOT AT ARMC)

## 2015-06-23 LAB — BRAIN NATRIURETIC PEPTIDE: B Natriuretic Peptide: 919.1 pg/mL — ABNORMAL HIGH (ref 0.0–100.0)

## 2015-06-23 MED ORDER — ALBUTEROL SULFATE (2.5 MG/3ML) 0.083% IN NEBU
5.0000 mg | INHALATION_SOLUTION | Freq: Once | RESPIRATORY_TRACT | Status: AC
  Administered 2015-06-23: 5 mg via RESPIRATORY_TRACT
  Filled 2015-06-23: qty 6

## 2015-06-23 MED ORDER — DILTIAZEM LOAD VIA INFUSION
10.0000 mg | Freq: Once | INTRAVENOUS | Status: AC
  Administered 2015-06-23: 10 mg via INTRAVENOUS
  Filled 2015-06-23: qty 10

## 2015-06-23 MED ORDER — FUROSEMIDE 20 MG PO TABS: 20.0000 mg | ORAL_TABLET | Freq: Every day | ORAL | Status: DC

## 2015-06-23 MED ORDER — HEPARIN SODIUM (PORCINE) 5000 UNIT/ML IJ SOLN
5000.0000 [IU] | Freq: Three times a day (TID) | INTRAMUSCULAR | Status: DC
  Administered 2015-06-24: 5000 [IU] via SUBCUTANEOUS
  Filled 2015-06-23: qty 1

## 2015-06-23 MED ORDER — SODIUM CHLORIDE 0.9 % IV BOLUS (SEPSIS): 500.0000 mL | INTRAVENOUS | Status: AC

## 2015-06-23 MED ORDER — SODIUM POLYSTYRENE SULFONATE 15 GM/60ML PO SUSP
30.0000 g | Freq: Once | ORAL | Status: DC
Start: 2015-06-23 — End: 2015-06-23
  Filled 2015-06-23: qty 120

## 2015-06-23 MED ORDER — INSULIN ASPART 100 UNIT/ML ~~LOC~~ SOLN
10.0000 [IU] | Freq: Once | SUBCUTANEOUS | Status: AC
  Administered 2015-06-23: 10 [IU] via INTRAVENOUS
  Filled 2015-06-23: qty 1

## 2015-06-23 MED ORDER — VANCOMYCIN HCL IN DEXTROSE 1-5 GM/200ML-% IV SOLN
1000.0000 mg | Freq: Once | INTRAVENOUS | Status: AC
  Administered 2015-06-23: 1000 mg via INTRAVENOUS
  Filled 2015-06-23: qty 200

## 2015-06-23 MED ORDER — SODIUM CHLORIDE 0.9 % IJ SOLN: 3.0000 mL | Freq: Two times a day (BID) | INTRAMUSCULAR | Status: DC

## 2015-06-23 MED ORDER — DILTIAZEM HCL 100 MG IV SOLR
5.0000 mg/h | INTRAVENOUS | Status: DC
  Administered 2015-06-23: 5 mg/h via INTRAVENOUS
  Filled 2015-06-23: qty 100

## 2015-06-23 MED ORDER — SODIUM POLYSTYRENE SULFONATE 15 GM/60ML PO SUSP: 30.0000 g | Freq: Once | ORAL | Status: DC

## 2015-06-23 MED ORDER — PIPERACILLIN-TAZOBACTAM 3.375 G IVPB 30 MIN
3.3750 g | Freq: Once | INTRAVENOUS | Status: AC
  Administered 2015-06-23: 3.375 g via INTRAVENOUS
  Filled 2015-06-23: qty 50

## 2015-06-23 MED ORDER — SODIUM CHLORIDE 0.9 % IV BOLUS (SEPSIS)
1000.0000 mL | INTRAVENOUS | Status: AC
  Administered 2015-06-23 (×2): 1000 mL via INTRAVENOUS

## 2015-06-23 MED ORDER — FUROSEMIDE 10 MG/ML IJ SOLN
20.0000 mg | Freq: Every day | INTRAMUSCULAR | Status: DC
  Administered 2015-06-24: 20 mg via INTRAVENOUS
  Filled 2015-06-23: qty 2

## 2015-06-23 MED ORDER — ONDANSETRON HCL 4 MG/2ML IJ SOLN: 4.0000 mg | Freq: Four times a day (QID) | INTRAMUSCULAR | Status: DC | PRN

## 2015-06-23 MED ORDER — DEXTROSE 50 % IV SOLN
1.0000 | Freq: Once | INTRAVENOUS | Status: AC
  Administered 2015-06-23: 50 mL via INTRAVENOUS
  Filled 2015-06-23: qty 50

## 2015-06-23 MED ORDER — SODIUM CHLORIDE 0.9 % IV BOLUS (SEPSIS)
500.0000 mL | Freq: Once | INTRAVENOUS | Status: AC
  Administered 2015-06-23: 500 mL via INTRAVENOUS

## 2015-06-23 MED ORDER — ONDANSETRON HCL 4 MG PO TABS: 4.0000 mg | ORAL_TABLET | Freq: Four times a day (QID) | ORAL | Status: DC | PRN

## 2015-06-23 MED ORDER — SODIUM CHLORIDE 0.9 % IV BOLUS (SEPSIS): 1000.0000 mL | INTRAVENOUS | Status: AC

## 2015-06-23 MED ORDER — SODIUM CHLORIDE 0.9 % IV SOLN
1.0000 g | Freq: Once | INTRAVENOUS | Status: AC
  Administered 2015-06-23: 1 g via INTRAVENOUS
  Filled 2015-06-23: qty 10

## 2015-06-23 MED ORDER — IPRATROPIUM BROMIDE 0.02 % IN SOLN
0.5000 mg | Freq: Once | RESPIRATORY_TRACT | Status: AC
Start: 2015-06-23 — End: 2015-06-23
  Administered 2015-06-23: 0.5 mg via RESPIRATORY_TRACT
  Filled 2015-06-23: qty 2.5

## 2015-06-23 NOTE — ED Provider Notes (Signed)
CSN: [id number]     Arrival date & time 06/23/15  1808 History   First MD Initiated Contact with Patient 06/23/15 1845     Chief Complaint  Patient presents with  . Leg Swelling    left     (Consider location/radiation/quality/duration/timing/severity/associated sxs/prior Treatment) The history is provided by the spouse and a relative.    Glen Baldwin is a 81 y.o. male with PMH significant for HTN, COPD, BPH, dementia (non-verbal), and bradycardia  who presents with gradually worsening left lower extremity edema x 3 days.  Patient takes Lasix 20 mg QD and they have tried elevation, but nothing has worked.  No history of PE/DVT.  Patient has not been ambulatory.  Daughter states that his breathing seems to be more labored as of late.    Wife reports that he fell 2 weeks ago and landed on his left side.  He was seen by his PCP, but family reports "they did not do anything."    Past Medical History  Diagnosis Date  . Hypertension   . COPD (chronic obstructive pulmonary disease) (HCC)   . Arthritis   . BPH (benign prostatic hyperplasia)   . Bursitis of shoulder   . Bradycardia   . Dementia    Past Surgical History  Procedure Laterality Date  . Total knee arthroplasty    . Lumbar laminectomy     Family History  Problem Relation Age of Onset  . Family history unknown: Yes   Social History  Substance Use Topics  . Smoking status: Former Games developer  . Smokeless tobacco: None  . Alcohol Use: No    Review of Systems  Unable to perform ROS: Dementia      Allergies  Review of patient's allergies indicates no known allergies.  Home Medications   Prior to Admission medications   Medication Sig Start Date End Date Taking? Authorizing Provider  acetaminophen (TYLENOL) 500 MG tablet Take 1,000 mg by mouth every 6 (six) hours as needed for moderate pain.   Yes Historical Provider, MD  furosemide (LASIX) 20 MG tablet Take 1 tablet (20 mg total) by mouth daily. 06/23/15  Yes  Eulis Foster, FNP  loperamide (IMODIUM) 2 MG capsule Take 2 mg by mouth as needed for diarrhea or loose stools.    Yes Historical Provider, MD  citalopram (CELEXA) 10 MG tablet Take 1 tablet (10 mg total) by mouth daily. Patient not taking: Reported on 06/23/2015 06/03/15   Terressa Koyanagi, DO  donepezil (ARICEPT) 10 MG tablet Take 1 tablet (10 mg total) by mouth at bedtime. Patient not taking: Reported on 06/23/2015 10/09/14   Terressa Koyanagi, DO  furosemide (LASIX) 20 MG tablet TAKE 1 TABLET BY MOUTH ONCE DAILY Patient not taking: Reported on 06/23/2015 01/22/15   Terressa Koyanagi, DO  memantine (NAMENDA) 10 MG tablet Take 1 tablet (10 mg total) by mouth 2 (two) times daily. Patient not taking: Reported on 06/23/2015 10/09/14   Terressa Koyanagi, DO  terazosin (HYTRIN) 5 MG capsule Take 1 capsule (5 mg total) by mouth at bedtime. Patient not taking: Reported on 06/23/2015 10/09/14   Terressa Koyanagi, DO  trandolapril (MAVIK) 4 MG tablet 1/2 tab twice a day Patient not taking: Reported on 06/23/2015 10/09/14   Terressa Koyanagi, DO  verapamil (CALAN-SR) 240 MG CR tablet Take 1 tablet (240 mg total) by mouth 2 (two) times daily. Patient not taking: Reported on 06/23/2015 10/09/14   Terressa Koyanagi, DO   BP  118/84 mmHg  Pulse 124  Temp(Src) 98.5 F (36.9 C) (Axillary)  Resp 24  SpO2 92% Physical Exam  Constitutional: He appears well-developed and well-nourished.  HENT:  Head: Normocephalic and atraumatic.  Mouth/Throat: Oropharynx is clear and moist.  Eyes: Conjunctivae are normal.  Neck: Normal range of motion. Neck supple.  Cardiovascular: Normal rate, regular rhythm and normal heart sounds.   No murmur heard. Capillary refill less than 3 seconds bilaterally in lower extremities.   Pulmonary/Chest: Effort normal. No accessory muscle usage or stridor. Tachypnea noted. No respiratory distress. He has wheezes. He has no rhonchi. He has no rales.  Abdominal: Soft. Bowel sounds are normal. He exhibits no distension. There  is no tenderness.  Musculoskeletal: Normal range of motion.  Lymphadenopathy:    He has no cervical adenopathy.  Neurological: He is alert.  Patient is non-verbal  Skin: Skin is warm and dry. No pallor.  2+ pitting edema in the left lower extremity up to distal calf.  No edema in right lower extremity.  No rash, warmth, or erythema.  No tortuous veins.  Unable to determine calf tenderness to palpation.   Psychiatric: He has a normal mood and affect. His behavior is normal.    ED Course  Procedures (including critical care time) Labs Review Labs Reviewed  CBC WITH DIFFERENTIAL/PLATELET - Abnormal; Notable for the following:    WBC 22.4 (*)    RDW 16.0 (*)    Platelets 65 (*)    Neutro Abs 19.1 (*)    Monocytes Absolute 1.5 (*)    All other components within normal limits  D-DIMER, QUANTITATIVE (NOT AT [facility]) - Abnormal; Notable for the following:    D-Dimer, Quant >20.00 (*)    All other components within normal limits  URINALYSIS, ROUTINE W REFLEX MICROSCOPIC (NOT AT [facility]) - Abnormal; Notable for the following:    Color, Urine RED (*)    APPearance TURBID (*)    Hgb urine dipstick SMALL (*)    Bilirubin Urine SMALL (*)    Nitrite POSITIVE (*)    Leukocytes, UA SMALL (*)    All other components within normal limits  BRAIN NATRIURETIC PEPTIDE - Abnormal; Notable for the following:    B Natriuretic Peptide 919.1 (*)    All other components within normal limits  COMPREHENSIVE METABOLIC PANEL - Abnormal; Notable for the following:    Potassium 7.5 (*)    CO2 13 (*)    Glucose, Bld 142 (*)    BUN 165 (*)    Creatinine, Ser 12.89 (*)    Calcium 8.4 (*)    Albumin 2.8 (*)    AST 43 (*)    Total Bilirubin 2.2 (*)    GFR calc non Af Amer 3 (*)    GFR calc Af Amer 3 (*)    Anion gap 22 (*)    All other components within normal limits  LIPASE, BLOOD - Abnormal; Notable for the following:    Lipase 78 (*)    All other components within normal limits  URINE MICROSCOPIC-ADD ON -  Abnormal; Notable for the following:    Squamous Epithelial / LPF FEW (*)    Bacteria, UA MANY (*)    All other components within normal limits  I-STAT TROPOININ, ED - Abnormal; Notable for the following:    Troponin i, poc 0.51 (*)    All other components within normal limits  I-STAT CHEM 8, ED - Abnormal; Notable for the following:    Potassium 7.1 (*)  Chloride 114 (*)    BUN >140 (*)    Creatinine, Ser 12.80 (*)    Glucose, Bld 141 (*)    Calcium, Ion 0.88 (*)    All other components within normal limits  I-STAT CG4 LACTIC ACID, ED - Abnormal; Notable for the following:    Lactic Acid, Venous 2.19 (*)    All other components within normal limits  CULTURE, BLOOD (ROUTINE X 2)  CULTURE, BLOOD (ROUTINE X 2)  URINE CULTURE  BLOOD GAS, VENOUS    Imaging Review Dg Chest 2 View  06/23/2015  CLINICAL DATA:  Shortness of breath for awhile, lower leg swelling for 3 days, history dementia, hypertension, COPD, atrial fibrillation EXAM: CHEST  2 VIEW COMPARISON:  09/01/2004 FINDINGS: Enlargement of cardiac silhouette with pulmonary vascular congestion. RIGHT pleural effusion with basilar atelectasis versus consolidation. Minimal perihilar infiltrate likely representing pulmonary edema. No pneumothorax. Bones demineralized. IMPRESSION: Enlargement of cardiac silhouette with pulmonary vascular congestion and question minimal perihilar edema. Large RIGHT pleural effusion with atelectasis versus consolidation in lower RIGHT lung ; underlying abnormalities at the RIGHT lung base not excluded. Electronically Signed   By: Ulyses SouthwardMark  Boles M.D.   On: 06/23/2015 20:35   I have personally reviewed and evaluated these images and lab results as part of my medical decision-making.   EKG Interpretation None      MDM   Final diagnoses:  Shortness of breath  Left leg swelling  Acute kidney injury (HCC)  Hyperkalemia  Sepsis, due to unspecified organism [facility](HCC)    Patient presents with 3 day history  of gradually worsening left lower extremity swelling. On exam, wheezing noted throughout.  Labs ordered.  EKG, CXR, CTA ordered. Blood cultures and urine cultures.  -Troponin 0.51.  EKG shows afib w/ RVR.  Diltiazem drip started.  BNP 919. -D-dimer >20.  Due to renal function no CTA.  V/Q scan pending. -CBC 22.4. -CXR shows large right pleural effusion vs consolidation in lower right lung.  Will begin IV vanc and zosyn.  Sepsis fluids initiated. Lactic acid 2.19. Blood culture pending. -UA shows signs of infection.  Organism coverage w/ vanc and zosyn.  Culture pending. -CMP shows Cr of 12.8 and K 7.1.  Patient is receiving fluids per sepsis protocol.  Given calcium gluconate, insulin, D50, and kayexalate for hyperkalemia.   Admit to medicine to step down for sepsis.   Case has been discussed with and seen by Dr. Karma GanjaLinker who agrees with the above plan for admission.        Cheri FowlerKayla Coby [place], PA-C 06/23/15 2223  Jerelyn ScottMartha Linker, MD 06/23/15 2249  Jerelyn ScottMartha Linker, MD 06/23/15 2300

## 2015-06-23 NOTE — Telephone Encounter (Signed)
Dr Smith MinceMazzocchi, from hospice,  Is calling to inform Dr Selena BattenKim that Glen Baldwin does not qualify for hospice.  She feels that home health would work better for him.  She would like Dr Selena BattenKim to give her a call if possible.  Her cell number is [phone].

## 2015-06-23 NOTE — Telephone Encounter (Signed)
Padonda reviewed the pts chart in Dr Elmyra RicksKim's absence and ordered Lasix 20mg  to take once a day for 2 days and to see what Dr Selena BattenKim recommends after this.  I called the pts daughter and informed her of this and she stated the pt is short of breath and if he gets worse they will call an ambulance for him.

## 2015-06-23 NOTE — H&P (Addendum)
[facility] History and Physical  Glen Baldwin ZOX:[id number] DOB: 02/18/1925 DOA: 06/23/2015  Referring physician: Dr Criss [facility] PCP: Terressa KoyanagiKIM, HANNAH R., DO   Chief Complaint: Difficulty breathing, LE swelling  HPI: Glen MapleJerry C Deans is a 81 y.o. male  *Low 5 caveat: History provided by patient's wife and daughters who are his caregivers. Patient severely demented and noncommunicative at baseline. Per report by family patient has had 4 days of constant progressive ongoing weakness and difficulty breathing. Patient at baseline will make a few comments though they are unrelated questions asked and nonsensical but over the last 3 days he has not said anything. Patient does cry out in pain however whenever his lower extremities are moved. Patient had been ambulatory with a cane up until approximately 2 weeks ago. Since that time he has just been too weak to ambulate. Patient is tolerating by mouth when stimulated to do so. Patient has complained of headache and was noted to be febrile this morning which was relieved with Tylenol. No medication changes recently. This is only on Lomotil and furosemide. Furosemide 20 mg was kept at his baseline without any improvement in his lower extremity swelling. Family had contacted hospice to have placement paced on hospice but this had not yet been completed.   Review of Systems:  Able to obtain further review systems.  Past Medical History  Diagnosis Date  . Hypertension   . COPD (chronic obstructive pulmonary disease) (HCC)   . Arthritis   . BPH (benign prostatic hyperplasia)   . Bursitis of shoulder   . Bradycardia   . Dementia    Past Surgical History  Procedure Laterality Date  . Total knee arthroplasty    . Lumbar laminectomy     Social History:  reports that he has quit smoking. He does not have any smokeless tobacco history on file. He reports that he does not drink alcohol. His drug history is not on file.  No Known Allergies  Family  History  Problem Relation Age of Onset  . Family history unknown: Yes     Prior to Admission medications   Medication Sig Start Date End Date Taking? Authorizing Provider  acetaminophen (TYLENOL) 500 MG tablet Take 1,000 mg by mouth every 6 (six) hours as needed for moderate pain.   Yes Historical Provider, MD  furosemide (LASIX) 20 MG tablet Take 1 tablet (20 mg total) by mouth daily. 06/23/15  Yes Eulis FosterPadonda B Webb, FNP  loperamide (IMODIUM) 2 MG capsule Take 2 mg by mouth as needed for diarrhea or loose stools.    Yes Historical Provider, MD   Physical Exam: Filed Vitals:   06/23/15 2230 06/23/15 2245 06/23/15 2252 06/23/15 2300  BP: 126/101  126/101 141/69  Pulse: 87 99 97 62  Temp:      TempSrc:      Resp: 30 24 21 18   SpO2: 95% 97% 95% 96%    Wt Readings from Last 3 Encounters:  10/07/14 104.463 kg (230 lb 4.8 oz)  04/26/12 103.42 kg (228 lb)  10/27/11 106.142 kg (234 lb)    General: Agonal breathing, uncomfortable. Eyes:  EOMI, normal lids, iris ENT: Dry mucous membranes Neck: no LAD, masses or thyromegaly Cardiovascular:  RRR, II/VI systolic murmur. Right lower extremity 1+ pitting edema, lower extremity 2-3+ pitting edema.   Respiratory:  Agonal breathing, decreased breath sounds in the bases with crackles. On nasal cannula. Abdomen:  soft, ntnd Skin:  no rash or induration seen on limited exam Musculoskeletal:  Difficulty ascertaining due to pts status Psychiatric: Pt non-communicative at baseline. Does not follow commands Neurologic: Moves all extremities, unable to garner further neurologic exam due to mental status. .          Labs on Admission:  Basic Metabolic Panel:  Recent Labs Lab 06/23/15 2040 06/23/15 2138  NA 141 137  K 7.5* 7.1*  CL 106 114*  CO2 13*  --   GLUCOSE 142* 141*  BUN 165* >140*  CREATININE 12.89* 12.80*  CALCIUM 8.4*  --    Liver Function Tests:  Recent Labs Lab 06/23/15 2040  AST 43*  ALT 31  ALKPHOS 100  BILITOT 2.2*    PROT 6.6  ALBUMIN 2.8*    Recent Labs Lab 06/23/15 2040  LIPASE 78*   No results for input(s): AMMONIA in the last 168 hours. CBC:  Recent Labs Lab 06/23/15 1936 06/23/15 2138  WBC 22.4*  --   NEUTROABS 19.1*  --   HGB 16.5 17.0  HCT 48.4 50.0  MCV 92.5  --   PLT 65*  --    Cardiac Enzymes: No results for input(s): CKTOTAL, CKMB, CKMBINDEX, TROPONINI in the last 168 hours.  BNP (last 3 results)  Recent Labs  06/23/15 1936  BNP 919.1*    ProBNP (last 3 results) No results for input(s): PROBNP in the last 8760 hours.  CBG: No results for input(s): GLUCAP in the last 168 hours.  Radiological Exams on Admission: Dg Chest 2 View  06/23/2015  CLINICAL DATA:  Shortness of breath for awhile, lower leg swelling for 3 days, history dementia, hypertension, COPD, atrial fibrillation EXAM: CHEST  2 VIEW COMPARISON:  09/01/2004 FINDINGS: Enlargement of cardiac silhouette with pulmonary vascular congestion. RIGHT pleural effusion with basilar atelectasis versus consolidation. Minimal perihilar infiltrate likely representing pulmonary edema. No pneumothorax. Bones demineralized. IMPRESSION: Enlargement of cardiac silhouette with pulmonary vascular congestion and question minimal perihilar edema. Large RIGHT pleural effusion with atelectasis versus consolidation in lower RIGHT lung ; underlying abnormalities at the RIGHT lung base not excluded. Electronically Signed   By: Ulyses Southward M.D.   On: 06/23/2015 20:35     Assessment/Plan Principal Problem:   Sepsis (HCC) Active Problems:   Alzheimer's dementia   Acute renal failure (ARF) (HCC)   Hyperkalemia   Swelling of lower extremity   CAP (community acquired pneumonia)   Pyelonephritis   Sepsis: Patient presenting with sepsis likely secondary to pneumonia and urologic etiology. Patient cared for at home by family and was moving towards hospice when he became ill over the last 4 days. Lactic acid 2.19, WBC 22.4, troponin  0.51, BNP 919, tachycardic, tachypneic. Patient's wife and daughters aware that patient is extremely ill and may not survive his illness. Patient is DO NOT RESUSCITATE. - Telemetry (stepdown not available at [facility]. Patient's family does not [facility] - Vancomycin, Zosyn - F/u Jackolyn Confer - IVF - Hospice/Palliative care consult - PT is at end of life and family aware  Acute renal failure: Creatinine 12.89. Previous labs show normal creatinine in 2013 of 1.3. Likely secondary to infection and cardiorenal syndrome.  - +/- nephrology consult in a.m. - IVF per sepsis protocol  Hyperkalemia: K 7.1 on admission. Calcium gluconate and insulin/glucose in ED. - BMET 00:00.  - Further treatment as needed.   Dementia: alzheimers type. Noncommunicative at baseline. Has to be prompted to eat. - Monitor  LE swelling: heart failure vs DVT. BNP 919 but in renal failure. LE swelling x3-4  days much worse on L and sedentary for past 2-3 wks. D-dimer >20 - f/u VQ scan. Discussed anticoagulation during hospitalization but no further. Family may also be amenable to no anticoagulation during hospitalization. Consider discussion in the morning if necessary. - continue lasix in am  ----------------------- ADDENDUM 06/24/15 0:50  VQ scan neg for PE   LE doppler ordered for DVT -------------------------------------------  Code Status: DNR  DVT Prophylaxis: Hep Family Communication: Wife adn daughters Disposition Plan:  Pending Improvement    MERRELL, DAVID Shela Commons, MD Family Medicine [facility] [email or web address] Password [email or web address]

## 2015-06-23 NOTE — ED Notes (Signed)
Pt transferred to [facility] 

## 2015-06-23 NOTE — ED Notes (Addendum)
Hospitalist at bedside 

## 2015-06-23 NOTE — ED Notes (Signed)
Delayed in blood work due to pt is in Museum/gallery curatorX-ray

## 2015-06-23 NOTE — ED Notes (Signed)
Per EMS - patient comes from home where he lives with his wife, with c/o left lower extremity pitting edema.  Patient's wife noted edema 3 days ago and called patient's PCP, but she was out of the office.  Patient has dementia and is non-verbal per baseline.  Patient's vital signs 110/48, HR 90, 97% on RA.  Patient has a hx of dementia and atrial fib (he is not taking medications for that) and is getting ready to enter Hospice care.

## 2015-06-23 NOTE — Telephone Encounter (Signed)
Glen Baldwin, daughter, states pt's legs are swelling, one 1/2 larger to feet. Dr Smith MinceMazzocchi will not do any thing until she speaks w/ Dr Selena BattenKim.  Daughter states they cannot wait until wed when dr kim gets back.  They don't know what to do.  Would like to discuss w/ you ASAP  Call on home [phone] Or cell [phone]

## 2015-06-23 NOTE — ED Notes (Signed)
Bed: [id number] Expected date:  Expected time:  Means of arrival:  Comments: EMS- 81yo M, LLE edema/fall x 1 week/Hx of dementia

## 2015-06-24 ENCOUNTER — Inpatient Hospital Stay (HOSPITAL_COMMUNITY): Payer: Medicare Other

## 2015-06-24 ENCOUNTER — Encounter (HOSPITAL_COMMUNITY): Payer: Self-pay | Admitting: *Deleted

## 2015-06-24 DIAGNOSIS — A419 Sepsis, unspecified organism: Secondary | ICD-10-CM | POA: Diagnosis not present

## 2015-06-24 DIAGNOSIS — E875 Hyperkalemia: Secondary | ICD-10-CM

## 2015-06-24 DIAGNOSIS — R0602 Shortness of breath: Secondary | ICD-10-CM | POA: Diagnosis not present

## 2015-06-24 DIAGNOSIS — Z515 Encounter for palliative care: Secondary | ICD-10-CM

## 2015-06-24 DIAGNOSIS — G309 Alzheimer's disease, unspecified: Secondary | ICD-10-CM

## 2015-06-24 DIAGNOSIS — M7989 Other specified soft tissue disorders: Secondary | ICD-10-CM

## 2015-06-24 DIAGNOSIS — J189 Pneumonia, unspecified organism: Secondary | ICD-10-CM

## 2015-06-24 DIAGNOSIS — Z7189 Other specified counseling: Secondary | ICD-10-CM | POA: Diagnosis not present

## 2015-06-24 DIAGNOSIS — F0280 Dementia in other diseases classified elsewhere without behavioral disturbance: Secondary | ICD-10-CM

## 2015-06-24 DIAGNOSIS — R942 Abnormal results of pulmonary function studies: Secondary | ICD-10-CM | POA: Diagnosis not present

## 2015-06-24 DIAGNOSIS — N179 Acute kidney failure, unspecified: Secondary | ICD-10-CM | POA: Diagnosis not present

## 2015-06-24 LAB — PROCALCITONIN: Procalcitonin: 0.68 ng/mL

## 2015-06-24 LAB — I-STAT CG4 LACTIC ACID, ED: LACTIC ACID, VENOUS: 2.86 mmol/L — AB (ref 0.5–2.0)

## 2015-06-24 LAB — BLOOD GAS, VENOUS
Acid-base deficit: 9.9 mmol/L — ABNORMAL HIGH (ref 0.0–2.0)
Bicarbonate: 12.1 mEq/L — ABNORMAL LOW (ref 20.0–24.0)
O2 Saturation: 92.2 %
PCO2 VEN: 19.3 mmHg — AB (ref 45.0–50.0)
PH VEN: 7.412 — AB (ref 7.250–7.300)
PO2 VEN: 67.6 mmHg — AB (ref 30.0–45.0)
Patient temperature: 98.6
TCO2: 10.2 mmol/L (ref 0–100)

## 2015-06-24 LAB — BASIC METABOLIC PANEL
Anion gap: 22 — ABNORMAL HIGH (ref 5–15)
Anion gap: 18 — ABNORMAL HIGH (ref 5–15)
BUN: 159 mg/dL — ABNORMAL HIGH (ref 6–20)
BUN: 169 mg/dL — AB (ref 6–20)
CALCIUM: 8.1 mg/dL — AB (ref 8.9–10.3)
CHLORIDE: 109 mmol/L (ref 101–111)
CO2: 11 mmol/L — AB (ref 22–32)
CO2: 13 mmol/L — ABNORMAL LOW (ref 22–32)
CREATININE: 12.73 mg/dL — AB (ref 0.61–1.24)
Calcium: 8.2 mg/dL — ABNORMAL LOW (ref 8.9–10.3)
Chloride: 112 mmol/L — ABNORMAL HIGH (ref 101–111)
Creatinine, Ser: 11.47 mg/dL — ABNORMAL HIGH (ref 0.61–1.24)
GFR calc Af Amer: 4 mL/min — ABNORMAL LOW (ref >60)
GFR calc non Af Amer: 3 mL/min — ABNORMAL LOW (ref >60)
GFR calc non Af Amer: 3 mL/min — ABNORMAL LOW (ref >60)
GFR, EST AFRICAN AMERICAN: 3 mL/min — AB (ref >60)
Glucose, Bld: 166 mg/dL — ABNORMAL HIGH (ref 65–99)
Glucose, Bld: 113 mg/dL — ABNORMAL HIGH (ref 65–99)
Potassium: 7 mmol/L (ref 3.5–5.1)
Potassium: 6.1 mmol/L (ref 3.5–5.1)
SODIUM: 142 mmol/L (ref 135–145)
Sodium: 143 mmol/L (ref 135–145)

## 2015-06-24 LAB — LACTIC ACID, PLASMA
LACTIC ACID, VENOUS: 2.6 mmol/L — AB (ref 0.5–2.0)
LACTIC ACID, VENOUS: 2.8 mmol/L — AB (ref 0.5–2.0)
Lactic Acid, Venous: 1.8 mmol/L (ref 0.5–2.0)

## 2015-06-24 LAB — COMPREHENSIVE METABOLIC PANEL
ALT: 26 U/L (ref 17–63)
ANION GAP: 19 — AB (ref 5–15)
AST: 37 U/L (ref 15–41)
Albumin: 2.7 g/dL — ABNORMAL LOW (ref 3.5–5.0)
Alkaline Phosphatase: 85 U/L (ref 38–126)
BUN: 166 mg/dL — ABNORMAL HIGH (ref 6–20)
CHLORIDE: 109 mmol/L (ref 101–111)
CO2: 15 mmol/L — AB (ref 22–32)
Calcium: 8.3 mg/dL — ABNORMAL LOW (ref 8.9–10.3)
Creatinine, Ser: 12.52 mg/dL — ABNORMAL HIGH (ref 0.61–1.24)
GFR, EST AFRICAN AMERICAN: 4 mL/min — AB (ref >60)
GFR, EST NON AFRICAN AMERICAN: 3 mL/min — AB (ref >60)
Glucose, Bld: 134 mg/dL — ABNORMAL HIGH (ref 65–99)
Potassium: 6.8 mmol/L (ref 3.5–5.1)
SODIUM: 143 mmol/L (ref 135–145)
Total Bilirubin: 1.7 mg/dL — ABNORMAL HIGH (ref 0.3–1.2)
Total Protein: 6.1 g/dL — ABNORMAL LOW (ref 6.5–8.1)

## 2015-06-24 LAB — CBC
HCT: 43.6 % (ref 39.0–52.0)
Hemoglobin: 14.5 g/dL (ref 13.0–17.0)
MCH: 31 pg (ref 26.0–34.0)
MCHC: 33.3 g/dL (ref 30.0–36.0)
MCV: 93.4 fL (ref 78.0–100.0)
PLATELETS: 61 10*3/uL — AB (ref 150–400)
RBC: 4.67 MIL/uL (ref 4.22–5.81)
RDW: 16.1 % — AB (ref 11.5–15.5)
WBC: 20.9 10*3/uL — ABNORMAL HIGH (ref 4.0–10.5)

## 2015-06-24 LAB — URINE CULTURE

## 2015-06-24 MED ORDER — OXYCODONE HCL 10 MG/0.5ML PO CONC: 5.0000 mg | ORAL | Status: AC | PRN

## 2015-06-24 MED ORDER — SODIUM CHLORIDE 0.9 % IV SOLN
1.0000 g | Freq: Once | INTRAVENOUS | Status: AC
  Administered 2015-06-24: 1 g via INTRAVENOUS
  Filled 2015-06-24: qty 10

## 2015-06-24 MED ORDER — CETYLPYRIDINIUM CHLORIDE 0.05 % MT LIQD
7.0000 mL | Freq: Two times a day (BID) | OROMUCOSAL | Status: DC
  Administered 2015-06-24: 7 mL via OROMUCOSAL

## 2015-06-24 MED ORDER — BISACODYL 10 MG RE SUPP: 10.0000 mg | Freq: Every day | RECTAL | Status: AC | PRN

## 2015-06-24 MED ORDER — ATROPINE SULFATE 1 % OP SOLN
4.0000 [drp] | OPHTHALMIC | Status: DC | PRN
  Filled 2015-06-24: qty 2

## 2015-06-24 MED ORDER — MORPHINE SULFATE 20 MG/5ML PO SOLN: 5.0000 mg | ORAL | Status: AC | PRN

## 2015-06-24 MED ORDER — LORAZEPAM 2 MG/ML PO CONC: 1.0000 mg | ORAL | Status: AC | PRN

## 2015-06-24 MED ORDER — LORAZEPAM 1 MG PO TABS: 1.0000 mg | ORAL_TABLET | ORAL | Status: DC | PRN

## 2015-06-24 MED ORDER — INSULIN ASPART 100 UNIT/ML ~~LOC~~ SOLN
10.0000 [IU] | Freq: Once | SUBCUTANEOUS | Status: AC
  Administered 2015-06-24: 10 [IU] via INTRAVENOUS

## 2015-06-24 MED ORDER — ONDANSETRON HCL 4 MG PO TABS: 4.0000 mg | ORAL_TABLET | Freq: Four times a day (QID) | ORAL | Status: AC | PRN

## 2015-06-24 MED ORDER — PIPERACILLIN-TAZOBACTAM IN DEX 2-0.25 GM/50ML IV SOLN
2.2500 g | Freq: Four times a day (QID) | INTRAVENOUS | Status: DC
  Administered 2015-06-24: 2.25 g via INTRAVENOUS
  Filled 2015-06-24 (×2): qty 50

## 2015-06-24 MED ORDER — HALOPERIDOL LACTATE 2 MG/ML PO CONC: 1.0000 mg | ORAL | Status: AC | PRN

## 2015-06-24 MED ORDER — SODIUM CHLORIDE 0.9 % IV SOLN
500.0000 mg | Freq: Once | INTRAVENOUS | Status: AC
  Administered 2015-06-24: 500 mg via INTRAVENOUS
  Filled 2015-06-24: qty 500

## 2015-06-24 MED ORDER — ACETAMINOPHEN 650 MG RE SUPP: 650.0000 mg | Freq: Four times a day (QID) | RECTAL | Status: DC | PRN

## 2015-06-24 MED ORDER — OXYCODONE HCL 20 MG/ML PO CONC: 5.0000 mg | ORAL | Status: DC | PRN

## 2015-06-24 MED ORDER — ACETAMINOPHEN 325 MG PO TABS: 650.0000 mg | ORAL_TABLET | Freq: Four times a day (QID) | ORAL | Status: DC | PRN

## 2015-06-24 MED ORDER — DEXTROSE 50 % IV SOLN: 1.0000 | Freq: Once | INTRAVENOUS | Status: DC

## 2015-06-24 MED ORDER — HYDROMORPHONE HCL 1 MG/ML IJ SOLN: 0.2000 mg | INTRAMUSCULAR | Status: DC | PRN

## 2015-06-24 MED ORDER — INSULIN ASPART 100 UNIT/ML ~~LOC~~ SOLN: 10.0000 [IU] | Freq: Once | SUBCUTANEOUS | Status: DC

## 2015-06-24 MED ORDER — ATROPINE SULFATE 1 % OP SOLN: 4.0000 [drp] | OPHTHALMIC | Status: AC | PRN

## 2015-06-24 MED ORDER — BISACODYL 10 MG RE SUPP: 10.0000 mg | Freq: Every day | RECTAL | Status: DC | PRN

## 2015-06-24 MED ORDER — LORAZEPAM 2 MG/ML PO CONC: 1.0000 mg | ORAL | Status: DC | PRN

## 2015-06-24 MED ORDER — DEXTROSE 50 % IV SOLN
1.0000 | Freq: Once | INTRAVENOUS | Status: AC
  Administered 2015-06-24: 50 mL via INTRAVENOUS
  Filled 2015-06-24: qty 50

## 2015-06-24 MED ORDER — SODIUM CHLORIDE 0.9 % IV BOLUS (SEPSIS)
500.0000 mL | Freq: Once | INTRAVENOUS | Status: AC
  Administered 2015-06-24: 500 mL via INTRAVENOUS

## 2015-06-24 MED ORDER — SODIUM POLYSTYRENE SULFONATE 15 GM/60ML PO SUSP: 30.0000 g | Freq: Once | ORAL | Status: DC

## 2015-06-24 MED ORDER — LORAZEPAM 1 MG PO TABS
1.0000 mg | ORAL_TABLET | ORAL | Status: DC | PRN
Start: 2015-06-24 — End: 2015-06-24

## 2015-06-24 MED ORDER — LORAZEPAM 2 MG/ML IJ SOLN: 1.0000 mg | INTRAMUSCULAR | Status: DC | PRN

## 2015-06-24 MED ORDER — TECHNETIUM TO 99M ALBUMIN AGGREGATED
4.1000 | Freq: Once | INTRAVENOUS | Status: AC | PRN
  Administered 2015-06-24: 4 via INTRAVENOUS

## 2015-06-24 MED ORDER — SODIUM CHLORIDE 0.9 % IV SOLN
1.0000 g | Freq: Once | INTRAVENOUS | Status: DC
  Filled 2015-06-24: qty 10

## 2015-06-24 NOTE — Progress Notes (Signed)
CRITICAL VALUE ALERT  Critical value received:  Potassium 7.0  Date of notification:  06/24/2015  Time of notification:  0224  Critical value read back: yes  Nurse who received alert:  Claudie ReveringKatie Dunn   MD notified (1st page):  Kirtland BouchardK. Schorr  Time of first page:  0230  MD notified (2nd page):  Time of second page:  Responding MD:  Merdis DelayK. Schorr  Time MD responded: [phone]

## 2015-06-24 NOTE — Progress Notes (Signed)
I met with Mr. Galik wife and one of his daughters.  They understand that he is dying and want to focus on his comfort.  Eventual goal is to get him back home with hospice support for end-of-life care.    They need to talk with the rest of his family to discuss plan and to arrange 24 hour care at his home.  Family present today are confident that 24 hour caregiving will not be a problem to arrange.  Case management aware and will work to present options for hospice.  I will meet with the rest of the family this afternoon if they need to discuss further.  I changed his medications to focus on comfort, cancelled any further testing, and anticipate discharge this evening or (more likely) tomorrow (assuming he remains clinically stable for transport).  If he decompensates, this will be a terminal admission.  Full consult to follow.  Micheline Rough, MD [place] Team [phone]

## 2015-06-24 NOTE — Progress Notes (Signed)
CRITICAL VALUE ALERT  Critical value received:  Lactic acid  Date of notification:  06/24/2015  Time of notification:  0407  Critical value read back:Yes.    Nurse who received alert:  Claudie ReveringKatie Dunn  MD notified (1st page):  Kirtland BouchardK. Schorr  Time of first page:  [phone]  MD notified (2nd page):  Time of second page:  Responding MD:  Merdis DelayK. Schorr  Time MD responded:  [phone]

## 2015-06-24 NOTE — Progress Notes (Signed)
CRITICAL VALUE ALERT  Critical value received:  Potassium  Date of notification:  06/24/2015  Time of notification:  0407   Critical value read back:Yes.    Nurse who received alert:  Claudie ReveringKatie Dunn   MD notified (1st page):  Kirtland BouchardK. Schorr  Time of first page:  [phone]  MD notified (2nd page):  Time of second page:  Responding MD:  Merdis DelayK. Schorr  Time MD responded:  [phone]

## 2015-06-24 NOTE — Progress Notes (Signed)
Notified by Christa SeeMyarette, [facility]RN of pt[facility]family request for [facility] at home after discharge. Chart and pt. Information is in the process of being reviewed with Dr. Berlinda Last. [place] Medical Director.  Wrter spoke with pt's wife at the bedside to initiate education related to hospice philosophy, services and team approach to care. Family voiced understanding of information provided. Family requests foley catheter be left in at discharge for comfort. Per discussion plan is for discharge to home by [facility] today.  Please send signed and completed DNR home with pt./family. Pt. will need prescriptions for discharge comfort medications.  DME needs discussed and family requests O2 at 2 L [place], and a hospital bed with 1/2 rails. Suction and a nebulizer will be sent out also as part of the package. [facility] equipment manager Jewel Kizzie BaneHughes notified and will contact [facility] for delivery to the home today. The home address has been verified and is correct in the chart. Waynetta SandyBeth is the family member to contact to be called to arrange time of delivery.  [facility] aware of above.Please notify [facility] when pt. Is ready to leave the unit at discharge- call [phone] ( or  [phone] after 5 pm). [facility] information and contact numbers have been given to pt's wife during visit. Above information shared with Myarette, CMRN. Please call with any questions.  Sharen HeckLisa Strandberg RN [facility] Liaison [phone]

## 2015-06-24 NOTE — Progress Notes (Signed)
ANTIBIOTIC CONSULT NOTE - INITIAL  Pharmacy Consult for Vancomycin and Zosyn  Indication: pneumonia, sepsis  No Known Allergies  Patient Measurements: Height: 5\' 9"  (175.3 cm) Weight: 226 lb 8 oz (102.74 kg) IBW/kg (Calculated) : 70.7 Adjusted Body Weight:   Vital Signs: Temp: 98.5 F (36.9 C) (10/17 1828) Temp Source: Axillary (10/17 1828) BP: 91/71 mmHg (10/18 0239) Pulse Rate: 87 (10/18 0239) Intake/Output from previous day:   Intake/Output from this shift:    Labs:  Recent Labs  06/23/15 1936 06/23/15 2040 06/23/15 2138 06/24/15 0033 06/24/15 0255  WBC 22.4*  --   --   --  20.9*  HGB 16.5  --  17.0  --  14.5  PLT 65*  --   --   --  61*  CREATININE  --  12.89* 12.80* 12.73*  --    Estimated Creatinine Clearance: 4.7 mL/min (by C-G formula based on Cr of 12.73). No results for input(s): VANCOTROUGH, VANCOPEAK, VANCORANDOM, GENTTROUGH, GENTPEAK, GENTRANDOM, TOBRATROUGH, TOBRAPEAK, TOBRARND, AMIKACINPEAK, AMIKACINTROU, AMIKACIN in the last 72 hours.   Microbiology: No results found for this or any previous visit (from the past 720 hour(s)).  Medical History: Past Medical History  Diagnosis Date  . Hypertension   . COPD (chronic obstructive pulmonary disease) (HCC)   . Arthritis   . BPH (benign prostatic hyperplasia)   . Bursitis of shoulder   . Bradycardia   . Dementia     Medications:  Anti-infectives    Start     Dose/Rate Route Frequency Ordered Stop   06/24/15 0600  piperacillin-tazobactam (ZOSYN) IVPB 2.25 g     2.25 g 100 mL/hr over 30 Minutes Intravenous 4 times per day 06/24/15 0334     06/24/15 0345  vancomycin (VANCOCIN) 500 mg in sodium chloride 0.9 % 100 mL IVPB     500 mg 100 mL/hr over 60 Minutes Intravenous  Once 06/24/15 0333     06/23/15 2130  piperacillin-tazobactam (ZOSYN) IVPB 3.375 g     3.375 g 100 mL/hr over 30 Minutes Intravenous  Once 06/23/15 2117 06/23/15 2339   06/23/15 2130  vancomycin (VANCOCIN) IVPB 1000 mg/200 mL  premix     1,000 mg 200 mL/hr over 60 Minutes Intravenous  Once 06/23/15 2117 06/24/15 0103     Assessment: Patient in ED with SOB and LE swelling.  Patient with sepsis, ARF, CAP, Pyelonephritis.  First dose of antibiotics already given    Goal of Therapy:  Vancomycin trough level 15-20 mcg/ml  Zosyn based on renal function Appropriate antibiotic dosing for renal function; eradication of infection   Plan:  Measure antibiotic drug levels at steady state Follow up culture results  Vancomycin 1gm iv x1 in ED (done), add 500mg  iv to make 1500mg  total. Check vancomycin random level 10/19 at 2200 Zosyn 2.25gm iv q6hr  Darlina GuysGrimsley Jr, Jacquenette ShoneJulian Crowford 06/24/2015,3:39 AM

## 2015-06-24 NOTE — Telephone Encounter (Signed)
I have been out of the office and happened to log in and see this.

## 2015-06-24 NOTE — ED Notes (Signed)
Pt remains in [facility] °

## 2015-06-24 NOTE — Progress Notes (Signed)
CRITICAL VALUE ALERT  Critical value received:  Lactic acid 2.8  Date of notification:  06/24/2015  Time of notification:  0224   Critical value read back:Yes.    Nurse who received alert:  Claudie ReveringKatie Dunn   MD notified (1st page):  Kirtland BouchardK. Schorr  Time of first page:  0230  MD notified (2nd page):  Time of second page:  Responding MD:  Merdis DelayK. Schorr   Time MD responded:  [phone]

## 2015-06-24 NOTE — Telephone Encounter (Signed)
Tried number provided by for Dr. Smith MinceMazzocchi several times with no one on other end. Called and spoke with Jasmine DecemberSharon. Pt in hospital, they are waiting on discharge with hospice as all Glen Baldwin wants if for pt to be at home. Wants hospice at home. Reports she does not think anything is needed on our end.

## 2015-06-24 NOTE — Discharge Summary (Signed)
Physician Discharge Summary  Glen Baldwin ZOX:[id number] DOB: 09/15/1924 DOA: 06/23/2015  PCP: Kriste Basque R., DO  Admit date: 06/23/2015 Discharge date: 06/24/2015  Time spent: 20 minutes  Recommendations for Outpatient Follow-up:  1. Follow up on as needed basis only   Discharge Diagnoses:  Principal Problem:   Sepsis (HCC) Active Problems:   Alzheimer's dementia   Acute renal failure (ARF) (HCC)   Hyperkalemia   Swelling of lower extremity   CAP (community acquired pneumonia)   Pyelonephritis   Discharge Condition: Stable  Diet recommendation: Comfort feeds only  Filed Weights   06/24/15 0239  Weight: 102.74 kg (226 lb 8 oz)    History of present illness:  Please review dictated H and P from 10/17 for details. Briefly, Pt presented severely demented and noncommunicative at baseline. Per report by family patient has had 4 days of constant progressive ongoing weakness and difficulty breathing. Patient was admitted for further work up.  Hospital Course:  1. Sepsis with PNA and possible UTI, present on admit 1. Presenting WBC in the 20's with peak lactate of 2.8 2. Pt was started on empiric abx overnight 3. Presently afebrile 2. ARF 1. Suspect secondary to acute sepsis 2. Peak Cr of 12.7 3. Patient's wife states patient would NOT want dialysis thus Nephrology not consulted 4. Patient was continued on IVF overnight 5. Slight improvement in renal function this AM 3. Hyperkalemia 1. Likely secondary to profound renal failure 2. Improved with insulin and kayexalate  4. Advanced dementia 1. Seems stable at this time 5. LE edema 1. Suspect secondary to renal failure 6. DVT prophylaxis 1. Was on heparin sub 7. End of Life 1. Appreciate input by Palliative Care 2. Pt's wife is at bedside and is aware of pt's grave condition. Pt's wife specifically asked about hospice. She reaffirms pt's wishes for DNR/DNI and also for no dialysis 3. Hospice liaison consulted and  appreciate recs. 4. Pt's family desires pt to be discharged home with hospice 5. Plans for d/c to home with hospice  Consultations:  Palliative Care  Discharge Exam: Filed Vitals:   06/23/15 2252 06/23/15 2300 06/24/15 0239 06/24/15 0532  BP: 126/101 141/69 91/71 112/78  Pulse: 97 62 87 94  Temp:    97.6 F (36.4 C)  TempSrc:    Axillary  Resp: Height:    (1.753 m)   Weight:   102.74 kg (226 lb 8 oz)   SpO2: 95% 96% 97% 98%    General: Awake, in nad Cardiovascular: regular, s1, s2 Respiratory: normal resp effort, no wheezing  Discharge Instructions     Medication List    STOP taking these medications        furosemide 20 MG tablet  Commonly known as:  LASIX      TAKE these medications        acetaminophen 500 MG tablet  Commonly known as:  TYLENOL  Take 1,000 mg by mouth every 6 (six) hours as needed for moderate pain.     atropine 1 % ophthalmic solution  Place 4 drops under the tongue every 4 (four) hours as needed (excessive secretions).     bisacodyl 10 MG suppository  Commonly known as:  DULCOLAX  Place 1 suppository (10 mg total) rectally daily as needed for moderate constipation.     loperamide 2 MG capsule  Commonly known as:  IMODIUM  Take 2 mg by mouth as needed for diarrhea or loose stools.  LORazepam 1 MG tablet  Commonly known as:  ATIVAN  Take 1 tablet (1 mg total) by mouth every 4 (four) hours as needed for anxiety.     morphine 20 MG/5ML solution  Take 1.3 mLs (5.2 mg total) by mouth every 2 (two) hours as needed (PRN pain or sob).     ondansetron 4 MG tablet  Commonly known as:  ZOFRAN  Take 1 tablet (4 mg total) by mouth every 6 (six) hours as needed for nausea.       No Known Allergies   The results of significant diagnostics from this hospitalization (including imaging, microbiology, ancillary and laboratory) are listed below for reference.    Significant Diagnostic Studies: Dg Chest 2 View  06/23/2015   CLINICAL DATA:  Shortness of breath for awhile, lower leg swelling for 3 days, history dementia, hypertension, COPD, atrial fibrillation EXAM: CHEST  2 VIEW COMPARISON:  09/01/2004 FINDINGS: Enlargement of cardiac silhouette with pulmonary vascular congestion. RIGHT pleural effusion with basilar atelectasis versus consolidation. Minimal perihilar infiltrate likely representing pulmonary edema. No pneumothorax. Bones demineralized. IMPRESSION: Enlargement of cardiac silhouette with pulmonary vascular congestion and question minimal perihilar edema. Large RIGHT pleural effusion with atelectasis versus consolidation in lower RIGHT lung ; underlying abnormalities at the RIGHT lung base not excluded. Electronically Signed   By: Ulyses SouthwardMark  Boles M.D.   On: 06/23/2015 20:35   Nm Pulmonary Perfusion  06/24/2015  CLINICAL DATA:  Progressive shortness of breath for weeks. Right leg swelling. EXAM: NUCLEAR MEDICINE PERFUSION SCAN TECHNIQUE: Perfusion images were obtained in multiple projections after intravenous injection of radiopharmaceutical. Patient could not tolerate ventilation imaging secondary to dementia. RADIOPHARMACEUTICALS:  4.1 mCi Tc1918m MAA IV COMPARISON:  Chest radiograph 1 day prior. FINDINGS: Diminished perfusion to the right lung base, with pleural effusion and opacity noted on radiograph. There are no peripheral wedge-shaped perfusion defects. IMPRESSION: No peripheral wedge-shaped perfusion defects to suggest pulmonary embolus. There is decreased profusion in the right lower lung zone, with corresponding effusion and opacity on radiograph. No ventilation imaging was performed, therefore evaluation for triple matched defect (defect on perfusion, ventilation, and radiograph) cannot be assessed. A triple matched defect would be an intermediate probability scan, this is otherwise a low probability for pulmonary embolus. Electronically Signed   By: Rubye OaksMelanie  Ehinger M.D.   On: 06/24/2015 00:41     Microbiology: Recent Results (from the past 240 hour(s))  Urine culture     Status: None (Preliminary result)   Collection Time: 06/23/15  8:49 PM  Result Value Ref Range Status   Specimen Description URINE, RANDOM  Final   Special Requests NONE  Final   Culture   Final    TOO YOUNG TO READ [facility]     Report Status PENDING  Incomplete     Labs: Basic Metabolic Panel:  Recent Labs Lab 06/23/15 2040 06/23/15 2138 06/24/15 0033 06/24/15 0255 06/24/15 0650  NA 141 137 142 143 143  K 7.5* 7.1* 7.0* 6.8* 6.1*  CL 106 114* 109 109 112*  CO2 13*  --  11* 15* 13*  GLUCOSE 142* 141* 166* 134* 113*  BUN 165* >140* 169* 166* 159*  CREATININE 12.89* 12.80* 12.73* 12.52* 11.47*  CALCIUM 8.4*  --  8.1* 8.3* 8.2*   Liver Function Tests:  Recent Labs Lab 06/23/15 2040 06/24/15 0255  AST 43* 37  ALT 31 26  ALKPHOS 100 85  BILITOT 2.2* 1.7*  PROT 6.6 6.1*  ALBUMIN 2.8* 2.7*    Recent  Labs Lab 06/23/15 2040  LIPASE 78*   No results for input(s): AMMONIA in the last 168 hours. CBC:  Recent Labs Lab 06/23/15 1936 06/23/15 2138 06/24/15 0255  WBC 22.4*  --  20.9*  NEUTROABS 19.1*  --   --   HGB 16.5 17.0 14.5  HCT 48.4 50.0 43.6  MCV 92.5  --  93.4  PLT 65*  --  61*   Cardiac Enzymes: No results for input(s): CKTOTAL, CKMB, CKMBINDEX, TROPONINI in the last 168 hours. BNP: BNP (last 3 results)  Recent Labs  06/23/15 1936  BNP 919.1*    ProBNP (last 3 results) No results for input(s): PROBNP in the last 8760 hours.  CBG: No results for input(s): GLUCAP in the last 168 hours.  Signed:  Katieann Hungate, Scheryl Marten  [facility] 06/24/2015, 2:30 PM

## 2015-06-24 NOTE — Consult Note (Signed)
Consultation Note Date: 06/24/2015   Patient Name: Glen Baldwin  DOB: [birth year]  MRN: [id number]  Age / Sex: 81 y.o., male   PCP: Glen Kern, DO Referring Physician: No att. providers found  Reason for Consultation: Establishing goals of care  Palliative Care Assessment and Plan Summary of Established Goals of Care and Medical Treatment Preferences   Clinical Assessment/Narrative: Glen Baldwin is an 81 year old male with history of advanced Alzheimer's dementia admitted with sepsis, acute renal failure, hyperkalemia, that is thought likely due to community-acquired pneumonia versus pyelonephritis. I consulted for goals of care.  I met with Glen Baldwin, his wife, and his daughter Glen Baldwin. He is nonverbal and his wife and daughter provide history. They state the most important thing to him as being comfortable in his own home.  They report the doctors have been doing a good job explaining things to them, and they understand that he is at the end of his life.  His wife reports her main goal is to get him back to home with hospice support. We discussed exactly what this would look like. She reports that they will have any needed help as they have 10 children. She states it will not be a problem to get 24-hour care in the home.  We talked about various medications can be used for his comfort at home and we also talked about not needing to do any further testing or workup if the goal was to take him home for end-of-life care.  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 They're currently very busy in [employer]. His wife reports that she will need to talk to rest the family to see about arranging care at home but she is certain this is something he can be done without any difficulty finding 24-hour caregiving support.  - Plan for home with hospice support as soon as this can be arranged. I changed his medications to focus on comfort, cancelled any  further testing, and anticipate discharge this evening or (more likely) tomorrow (assuming he remains clinically stable for transport). If he decompensates, this will be a terminal admission.   Contacts/Participants in Discussion: Primary Decision Maker: Patient's wife  HCPOA: None on chart  Code Status/Advance Care Planning:  DO NOT RESUSCITATE  Symptom Management:  On discharge, would recommend scripts for: - Oxycodone Concentrate 27m/0.5ml: 560m(0.254msublingual every 1 hour as needed for pain or shortness of breath: Disp 24m63mLorazepam 2mg/21mconcentrated solution: 1mg (45mml) s49mingual every 4 hours as needed for anxiety: Disp 24ml - 80mol 2mg/ml s75mtion: 0.5mg (0.2525m subl10mal every 4 hours as needed for agitation or nausea: Disp 24ml   Psyc29mocial/Spiritual:   Support System: Strong through family  Desire for further Chaplaincy support:no  Prognosis: Hours - Days  Discharge Planning:  Home with Hospice       Chief Complaint/History of Present Illness:  81 year old 63le with dementia admitted with sepsis likely secondary to pneumonia or UTI  Primary Diagnoses  Present on Admission:  . Sepsis (HCC) . Alzhe[place] Oakr's dementia  Palliative Review of Systems: Patient nonverbal I have reviewed the medical record, interviewed the patient and family, and examined the patient. The following aspects are pertinent.  Past Medical History  Diagnosis Date  . Hypertension   . COPD (chronic obstructive pulmonary disease) (HCC)   . 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 BPH (benign prostatic hyperplasia)   . Bursitis of shoulder   . Bradycardia   . Dementia  Social History   Social History  . Marital Status: Married    Spouse Name: N/A  . Number of Children: N/A  . Years of Education: N/A   Social History Main Topics  . Smoking status: Former Research scientist (life sciences)  . Smokeless tobacco: None  . Alcohol Use: No  . Drug Use: None  . Sexual Activity: Not Currently   Other Topics Concern  .  None   Social History Narrative   Family History  Problem Relation Age of Onset  . Family history unknown: Yes   Scheduled Meds: . antiseptic oral rinse  7 mL Mouth Rinse BID  . furosemide  20 mg Intravenous Daily  . sodium chloride  3 mL Intravenous Q12H   Continuous Infusions:  PRN Meds:.acetaminophen **OR** acetaminophen, atropine, bisacodyl, HYDROmorphone (DILAUDID) injection, LORazepam **OR** LORazepam **OR** LORazepam, ondansetron **OR** ondansetron (ZOFRAN) IV, oxyCODONE **OR** oxyCODONE Medications Prior to Admission:  Prior to Admission medications   Medication Sig Start Date End Date Taking? Authorizing Provider  acetaminophen (TYLENOL) 500 MG tablet Take 1,000 mg by mouth every 6 (six) hours as needed for moderate pain.   Yes Historical Provider, MD  loperamide (IMODIUM) 2 MG capsule Take 2 mg by mouth as needed for diarrhea or loose stools.    Yes Historical Provider, MD  atropine 1 % ophthalmic solution Place 4 drops under the tongue every 4 (four) hours as needed (excessive secretions). 06/24/15   Donne Hazel, MD  bisacodyl (DULCOLAX) 10 MG suppository Place 1 suppository (10 mg total) rectally daily as needed for moderate constipation. 06/24/15   Donne Hazel, MD  haloperidol (HALDOL) 2 MG/ML solution Place 0.5 mLs (1 mg total) under the tongue every 2 (two) hours as needed for agitation (or nausea). 06/24/15   Micheline Rough, MD  LORazepam (LORAZEPAM INTENSOL) 2 MG/ML concentrated solution Place 0.5 mLs (1 mg total) under the tongue every 4 (four) hours as needed for anxiety or sleep. 06/24/15   Micheline Rough, MD  morphine 20 MG/5ML solution Take 1.3 mLs (5.2 mg total) by mouth every 2 (two) hours as needed (PRN pain or sob). 06/24/15   Donne Hazel, MD  ondansetron (ZOFRAN) 4 MG tablet Take 1 tablet (4 mg total) by mouth every 6 (six) hours as needed for nausea. 06/24/15   Donne Hazel, MD  OxyCODONE HCl 10 MG/0.5ML CONC Place 5 mg under the tongue every 2 (two) hours  as needed (Pain or Shortness of breath). 06/24/15   Micheline Rough, MD   No Known Allergies CBC:    Component Value Date/Time   WBC 20.9* 06/24/2015 0255   HGB 14.5 06/24/2015 0255   HCT 43.6 06/24/2015 0255   PLT 61* 06/24/2015 0255   MCV 93.4 06/24/2015 0255   NEUTROABS 19.1* 06/23/2015 1936   LYMPHSABS 1.8 06/23/2015 1936   MONOABS 1.5* 06/23/2015 1936   EOSABS 0.0 06/23/2015 1936   BASOSABS 0.0 06/23/2015 1936   Comprehensive Metabolic Panel:    Component Value Date/Time   NA 143 06/24/2015 0650   K 6.1* 06/24/2015 0650   CL 112* 06/24/2015 0650   CO2 13* 06/24/2015 0650   BUN 159* 06/24/2015 0650   CREATININE 11.47* 06/24/2015 0650   GLUCOSE 113* 06/24/2015 0650   CALCIUM 8.2* 06/24/2015 0650   AST 37 06/24/2015 0255   ALT 26 06/24/2015 0255   ALKPHOS 85 06/24/2015 0255   BILITOT 1.7* 06/24/2015 0255   PROT 6.1* 06/24/2015 0255   ALBUMIN 2.7* 06/24/2015 0255    Physical Exam: Vital  Signs: BP 112/78 mmHg  Pulse 94  Temp(Src) 97.6 F (36.4 C) (Axillary)  Resp 20  Ht _0  (1.753 m)  Wt 102.74 kg (226 lb 8 oz)  BMI 33.43 kg/m2  SpO2 98% SpO2: SpO2: 98 % O2 Device: O2 Device: Nasal Cannula O2 Flow Rate: O2 Flow Rate (L/min): 2 L/min Intake/output summary:  Intake/Output Summary (Last 24 hours) at 06/24/15 1806 Last data filed at 06/24/15 1402  Gross per 24 hour  Intake      0 ml  Output   2400 ml  Net  -2400 ml   LBM: Last BM Date: 06/24/15 Baseline Weight: Weight: 102.74 kg (226 lb 8 oz) Most recent weight: Weight: 102.74 kg (226 lb 8 oz)  Exam Findings:  General: Opens eyes, nonverbal, no acute distress.  HEENT: No bruits, no goiter, no JVD Heart: Regular rate and rhythm. No murmur appreciated. Lungs: Fair air movescattered rhonchi Abdomen: Soft, nontender, nondistended, positive bowel sounds.  Ext: Lower extremity edema Skin: Warm and dry Neuro: Unable to assess. will not follow commands.         Palliative Performance Scale: 20                 Additional Data Reviewed: Recent Labs     06/23/15  1936   06/23/15  2138   06/24/15  0255  06/24/15  0650  WBC  22.4*   --    --    --   20.9*   --   HGB  16.5   --   17.0   --   14.5   --   PLT  65*   --    --    --   61*   --   NA   --    < >  137   < >  143  143  BUN   --    < >  >140*   < >  166*  159*  CREATININE   --    < >  12.80*   < >  12.52*  11.47*   < > = values in this interval not displayed.     Time In: 0940 Time Out: 1055  Time Total: 75 Greater than 50%  of this time was spent counseling and coordinating care related to the above assessment and plan.  Signed by: Micheline Rough, MD  Micheline Rough, MD  06/24/2015, 6:06 PM  Please contact Palliative Medicine Team phone at [phone] for questions and concerns.

## 2015-06-24 NOTE — Progress Notes (Signed)
Spoke with pt's wife and daughter Waynetta SandyBeth at bedside to offer choice of Home with Hospice. [facility]. [facility]. Pt will need Hospital Bed.

## 2015-06-26 LAB — CULTURE, BLOOD (ROUTINE X 2)

## 2015-07-08 DEATH — deceased

## 2016-07-27 IMAGING — CR DG CHEST 2V
2 series · 2 of 2 positions shown · non-contrast
Comparison: 09/01/2004

CLINICAL DATA: Shortness of breath for awhile, lower leg swelling
for 3 days, history dementia, hypertension, COPD, atrial
fibrillation

EXAM:
CHEST  2 VIEW

[w chest lat]
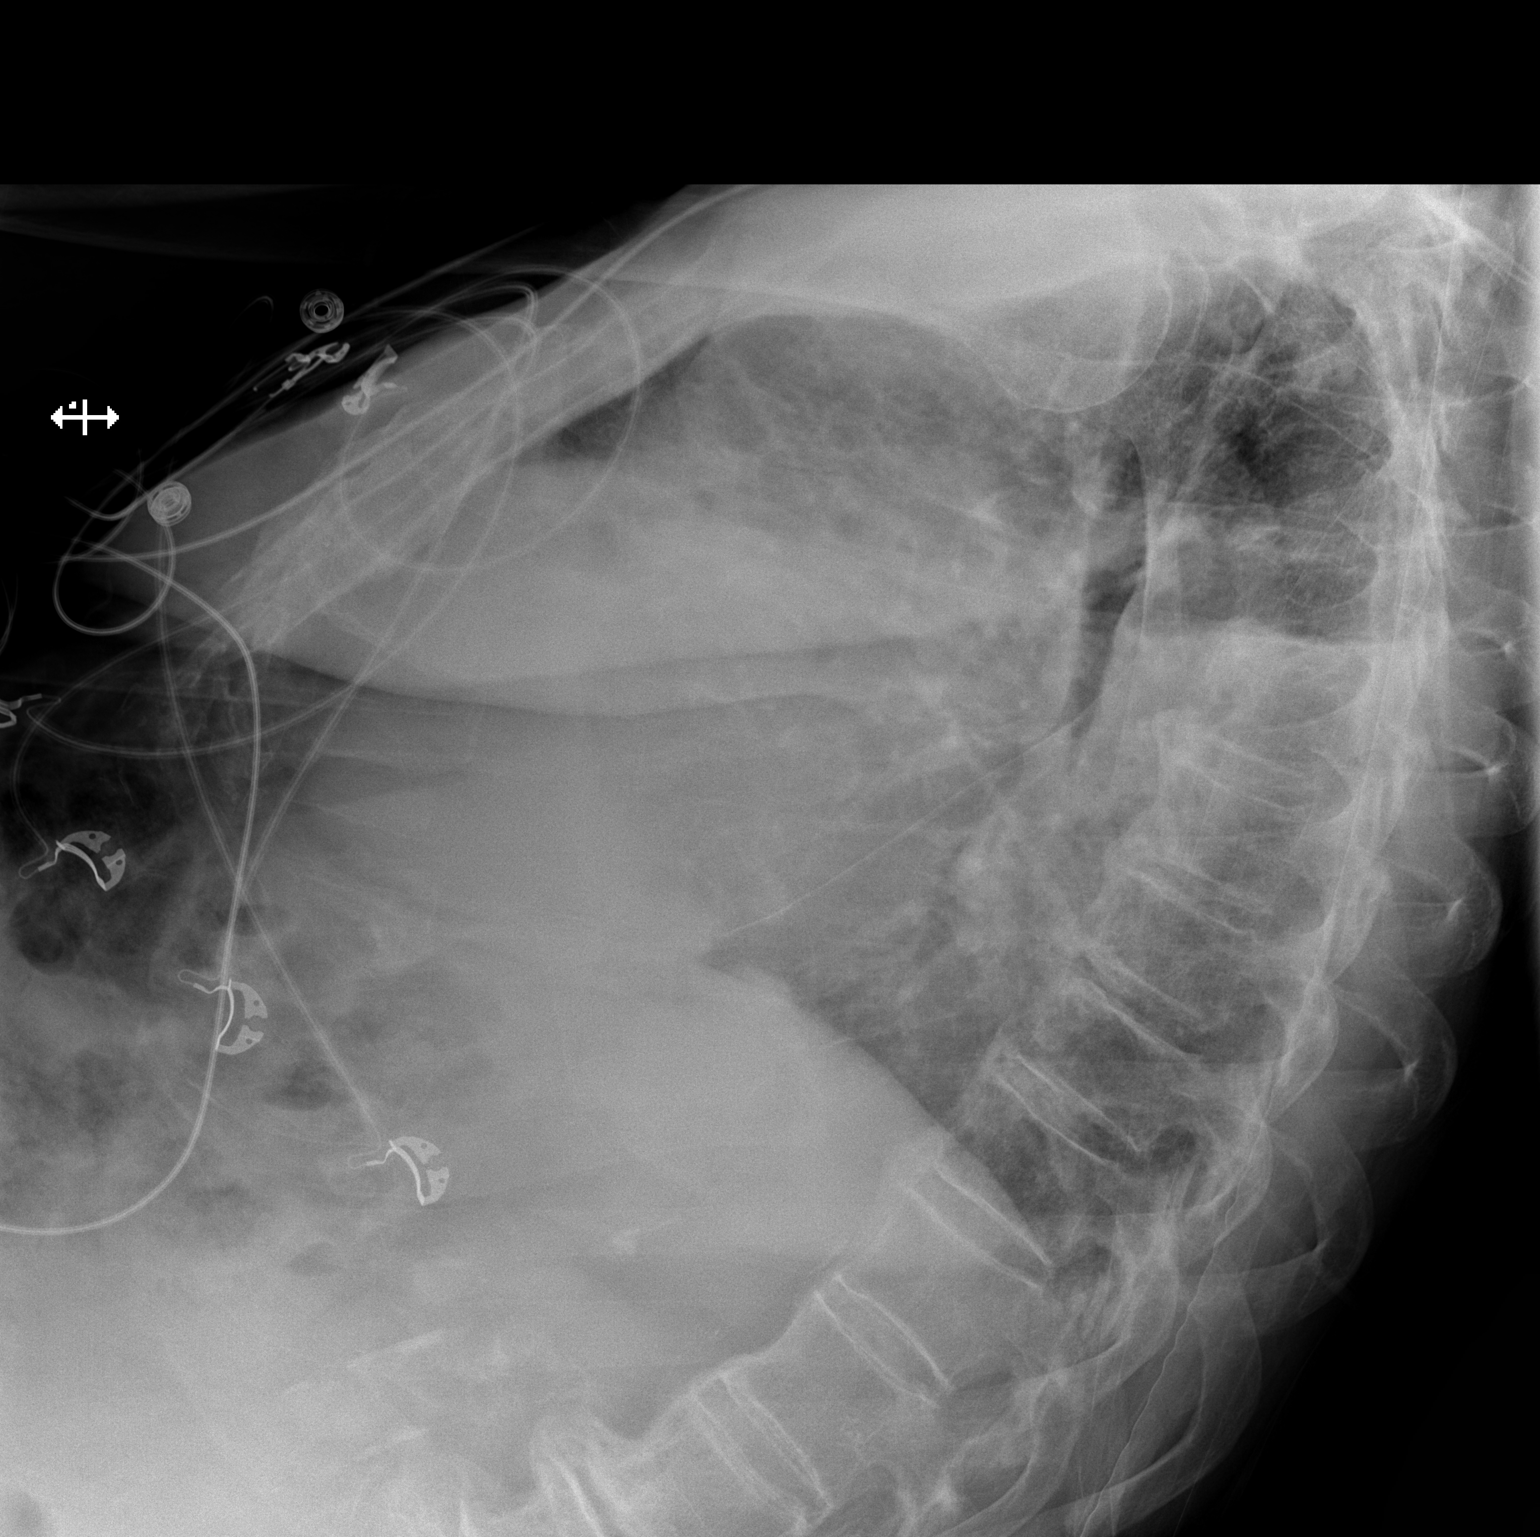

[x chest ap]
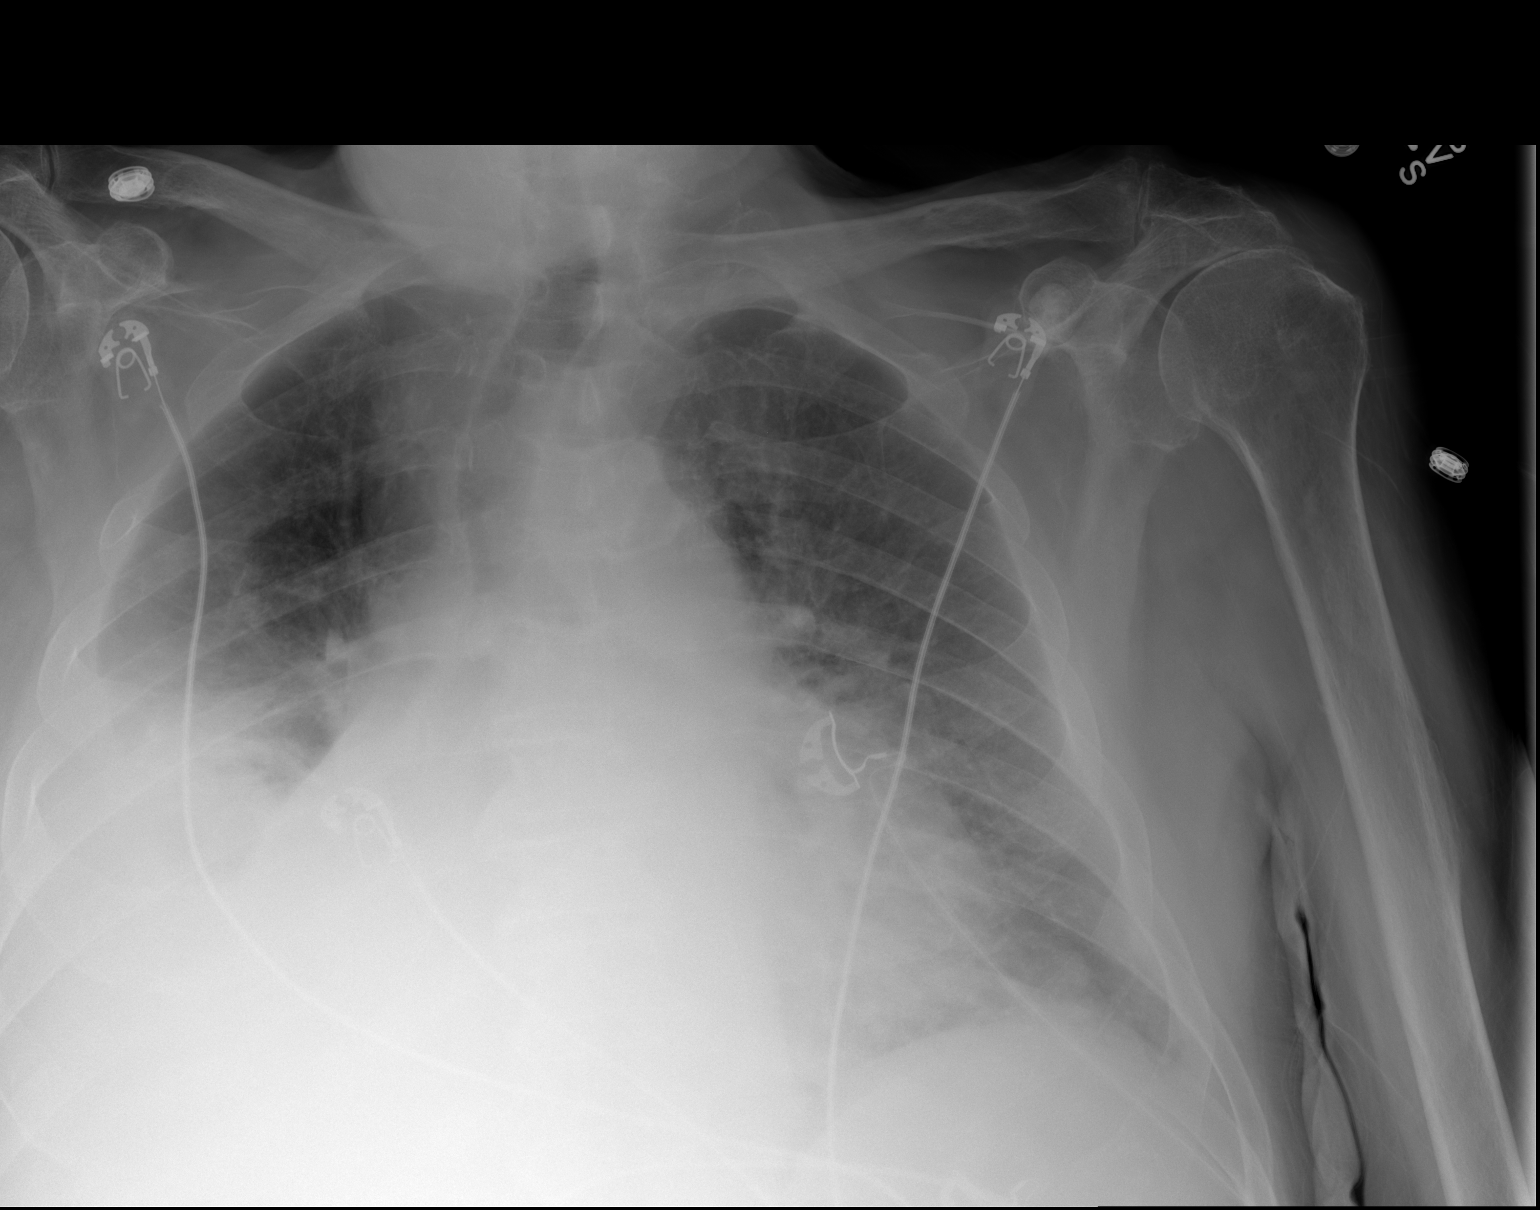

[2 of 2 positions shown; findings below may reference images not displayed]

FINDINGS: Enlargement of cardiac silhouette with pulmonary vascular
congestion.

RIGHT pleural effusion with basilar atelectasis versus
consolidation.

Minimal perihilar infiltrate likely representing pulmonary edema.

No pneumothorax.

Bones demineralized.
IMPRESSION: Enlargement of cardiac silhouette with pulmonary vascular congestion
and question minimal perihilar edema.

Large RIGHT pleural effusion with atelectasis versus consolidation
in lower RIGHT lung ; underlying abnormalities at the RIGHT lung
base not excluded.
